# Patient Record
Sex: Female | Born: 1965 | Race: White | Hispanic: No | Marital: Single | Smoking: Never smoker
Health system: Southern US, Community
[De-identification: ages and names within clinical notes are randomized; demographics above are authoritative.]

## PROBLEM LIST (undated history)

## (undated) DIAGNOSIS — J45909 Unspecified asthma, uncomplicated: Secondary | ICD-10-CM

## (undated) DIAGNOSIS — J449 Chronic obstructive pulmonary disease, unspecified: Secondary | ICD-10-CM

## (undated) DIAGNOSIS — E119 Type 2 diabetes mellitus without complications: Secondary | ICD-10-CM

## (undated) DIAGNOSIS — N289 Disorder of kidney and ureter, unspecified: Secondary | ICD-10-CM

## (undated) DIAGNOSIS — I10 Essential (primary) hypertension: Secondary | ICD-10-CM

## (undated) HISTORY — PX: OTHER SURGICAL HISTORY: SHX169

## (undated) HISTORY — PX: ABDOMINAL HYSTERECTOMY: SHX81

## (undated) HISTORY — PX: BREAST REDUCTION SURGERY: SHX8

## (undated) HISTORY — PX: TONSILLECTOMY: SUR1361

---

## 2012-04-25 ENCOUNTER — Emergency Department (HOSPITAL_COMMUNITY): Payer: Medicare Other

## 2012-04-25 ENCOUNTER — Encounter (HOSPITAL_COMMUNITY): Payer: Self-pay | Admitting: Emergency Medicine

## 2012-04-25 ENCOUNTER — Emergency Department (HOSPITAL_COMMUNITY)
Admission: EM | Admit: 2012-04-25 | Discharge: 2012-04-25 | Disposition: A | Discharge status: Home / Self Care | Payer: Medicare Other | Attending: Emergency Medicine | Admitting: Emergency Medicine

## 2012-04-25 DIAGNOSIS — R110 Nausea: Secondary | ICD-10-CM | POA: Insufficient documentation

## 2012-04-25 DIAGNOSIS — IMO0002 Reserved for concepts with insufficient information to code with codable children: Secondary | ICD-10-CM | POA: Insufficient documentation

## 2012-04-25 DIAGNOSIS — R0789 Other chest pain: Secondary | ICD-10-CM

## 2012-04-25 DIAGNOSIS — E1169 Type 2 diabetes mellitus with other specified complication: Secondary | ICD-10-CM | POA: Insufficient documentation

## 2012-04-25 DIAGNOSIS — Z79899 Other long term (current) drug therapy: Secondary | ICD-10-CM | POA: Insufficient documentation

## 2012-04-25 DIAGNOSIS — J4489 Other specified chronic obstructive pulmonary disease: Secondary | ICD-10-CM | POA: Insufficient documentation

## 2012-04-25 DIAGNOSIS — R071 Chest pain on breathing: Secondary | ICD-10-CM | POA: Insufficient documentation

## 2012-04-25 DIAGNOSIS — R0602 Shortness of breath: Secondary | ICD-10-CM | POA: Insufficient documentation

## 2012-04-25 DIAGNOSIS — R739 Hyperglycemia, unspecified: Secondary | ICD-10-CM

## 2012-04-25 DIAGNOSIS — I10 Essential (primary) hypertension: Secondary | ICD-10-CM | POA: Insufficient documentation

## 2012-04-25 DIAGNOSIS — Z87442 Personal history of urinary calculi: Secondary | ICD-10-CM | POA: Insufficient documentation

## 2012-04-25 DIAGNOSIS — J449 Chronic obstructive pulmonary disease, unspecified: Secondary | ICD-10-CM | POA: Insufficient documentation

## 2012-04-25 HISTORY — DX: Essential (primary) hypertension: I10

## 2012-04-25 HISTORY — DX: Type 2 diabetes mellitus without complications: E11.9

## 2012-04-25 HISTORY — DX: Disorder of kidney and ureter, unspecified: N28.9

## 2012-04-25 HISTORY — DX: Unspecified asthma, uncomplicated: J45.909

## 2012-04-25 HISTORY — DX: Chronic obstructive pulmonary disease, unspecified: J44.9

## 2012-04-25 LAB — HEPATIC FUNCTION PANEL
ALT: 35 U/L (ref 0–35)
AST: 25 U/L (ref 0–37)
Total Protein: 6.7 g/dL (ref 6.0–8.3)

## 2012-04-25 LAB — BASIC METABOLIC PANEL
CO2: 29 mEq/L (ref 19–32)
Chloride: 99 mEq/L (ref 96–112)
Glucose, Bld: 336 mg/dL — ABNORMAL HIGH (ref 70–99)
Potassium: 4.2 mEq/L (ref 3.5–5.1)
Sodium: 138 mEq/L (ref 135–145)

## 2012-04-25 LAB — CBC
Hemoglobin: 12.8 g/dL (ref 12.0–15.0)
RBC: 4.63 MIL/uL (ref 3.87–5.11)
WBC: 9.6 10*3/uL (ref 4.0–10.5)

## 2012-04-25 LAB — GLUCOSE, CAPILLARY: Glucose-Capillary: 271 mg/dL — ABNORMAL HIGH (ref 70–99)

## 2012-04-25 LAB — POCT I-STAT TROPONIN I: Troponin i, poc: 0 ng/mL (ref 0.00–0.08)

## 2012-04-25 MED ORDER — ONDANSETRON HCL 4 MG/2ML IJ SOLN
4.0000 mg | Freq: Once | INTRAMUSCULAR | Status: AC
  Administered 2012-04-25: 4 mg via INTRAVENOUS
  Filled 2012-04-25: qty 2

## 2012-04-25 MED ORDER — MORPHINE SULFATE 4 MG/ML IJ SOLN
4.0000 mg | Freq: Once | INTRAMUSCULAR | Status: AC
  Administered 2012-04-25: 4 mg via INTRAVENOUS
  Filled 2012-04-25: qty 1

## 2012-04-25 MED ORDER — NAPROXEN 500 MG PO TABS: 500.0000 mg | ORAL_TABLET | Freq: Two times a day (BID) | ORAL | Status: DC

## 2012-04-25 MED ORDER — KETOROLAC TROMETHAMINE 30 MG/ML IJ SOLN
30.0000 mg | Freq: Once | INTRAMUSCULAR | Status: AC
  Administered 2012-04-25: 30 mg via INTRAVENOUS
  Filled 2012-04-25: qty 1

## 2012-04-25 NOTE — ED Provider Notes (Signed)
Medical screening examination/treatment/procedure(s) were performed by non-physician practitioner and as supervising physician I was immediately available for consultation/collaboration.   Richardean Canal, MD 04/25/12 2350

## 2012-04-25 NOTE — ED Provider Notes (Signed)
History     CSN: [id number]  Arrival date & time 04/25/12  1949   First MD Initiated Contact with Patient 04/25/12 2052      Chief Complaint  Patient presents with  . Chest Pain    (Consider location/radiation/quality/duration/timing/severity/associated sxs/prior treatment) Patient is a 47 y.o. female presenting with chest pain. The history is provided by the patient.  Chest Pain Pain location:  R chest Pain quality: aching and radiating   Pain radiates to:  R shoulder Pain radiates to the back: no   Pain severity:  Mild Onset quality:  Sudden Duration:  2 days Timing:  Constant Chronicity:  New Context: at rest   Relieved by:  Nothing Worsened by:  Exertion Ineffective treatments:  Aspirin Associated symptoms: nausea and shortness of breath   Associated symptoms: no cough, no fever, no numbness, no palpitations, not vomiting and no weakness     Past Medical History  Diagnosis Date  . Diabetes mellitus without complication   . Hypertension   . Asthma   . COPD (chronic obstructive pulmonary disease)   . Renal disorder     kidney stones    Past Surgical History  Procedure Laterality Date  . Tonsillectomy    . Breast reduction surgery    . Cataracts    . Abdominal hysterectomy      No family history on file.  History  Substance Use Topics  . Smoking status: Never Smoker   . Smokeless tobacco: Not on file  . Alcohol Use: No    OB History   Grav Para Term Preterm Abortions TAB SAB Ect Mult Living                  Review of Systems  Constitutional: Negative for fever and chills.  Respiratory: Positive for shortness of breath. Negative for cough.   Cardiovascular: Positive for chest pain. Negative for palpitations and leg swelling.  Gastrointestinal: Positive for nausea. Negative for vomiting.  Genitourinary: Negative for dysuria.  Skin: Negative for rash and wound.  Neurological: Negative for weakness and numbness.    Allergies  Codeine and  Doxycycline  Home Medications   Current Outpatient Rx  Name  Route  Sig  Dispense  Refill  . albuterol (PROVENTIL) (2.5 MG/3ML) 0.083% nebulizer solution   Nebulization   Take 2.5 mg by nebulization every 6 (six) hours as needed for wheezing.         . Fluticasone-Salmeterol (ADVAIR) 250-50 MCG/DOSE AEPB   Inhalation   Inhale 1 puff into the lungs every 12 (twelve) hours.         Marland Kitchen glipiZIDE (GLUCOTROL) 10 MG tablet   Oral   Take 10 mg by mouth every morning.         Marland Kitchen lisinopril-hydrochlorothiazide (PRINZIDE,ZESTORETIC) 20-12.5 MG per tablet   Oral   Take 1 tablet by mouth 2 (two) times daily.         . metFORMIN (GLUCOPHAGE) 1000 MG tablet   Oral   Take 1,000 mg by mouth 2 (two) times daily with a meal.         . montelukast (SINGULAIR) 10 MG tablet   Oral   Take 10 mg by mouth every morning.         . naproxen (NAPROSYN) 500 MG tablet   Oral   Take 1 tablet (500 mg total) by mouth 2 (two) times daily with a meal.   20 tablet   0     BP 157/98  Pulse 95  Temp(Src) 98.1 F (36.7 C) (Oral)  Resp 10  Ht 5\' 6"  (1.676 m)  Wt 184 lb (83.462 kg)  BMI 29.71 kg/m2  SpO2 97%  Physical Exam  Constitutional: She is oriented to person, place, and time. She appears well-developed and well-nourished.  HENT:  Head: Normocephalic.  Eyes: Pupils are equal, round, and reactive to light.  Neck: Normal range of motion.  Cardiovascular: Normal rate.   Pulmonary/Chest: Effort normal. She has no wheezes. She exhibits tenderness.  Abdominal: Soft. She exhibits no distension. There is no tenderness.  Musculoskeletal: Normal range of motion.  Neurological: She is alert and oriented to person, place, and time.  Skin: Skin is warm and dry. No pallor.    ED Course  Procedures (including critical care time)  Labs Reviewed  BASIC METABOLIC PANEL - Abnormal; Notable for the following:    Glucose, Bld 336 (*)    All other components within normal limits  HEPATIC  FUNCTION PANEL - Abnormal; Notable for the following:    Albumin 3.2 (*)    All other components within normal limits  GLUCOSE, CAPILLARY - Abnormal; Notable for the following:    Glucose-Capillary 271 (*)    All other components within normal limits  CBC  D-DIMER, QUANTITATIVE  POCT I-STAT TROPONIN I   Dg Chest 2 View  04/25/2012  *RADIOLOGY REPORT*  Clinical Data: Chest pain, shortness of breath, COPD.  History of asthma, hypertension, diabetes.  CHEST - 2 VIEW  Comparison: 08/17/2004  Findings: The heart size and pulmonary vascularity are normal. The lungs appear clear and expanded without focal air space disease or consolidation. No blunting of the costophrenic angles. No pneumothorax.  Mediastinal contours appear intact.  IMPRESSION: No evidence of active pulmonary disease.   Original Report Authenticated By: Burman Nieves, M.D.     ED ECG REPORT   Date: 04/25/2012  EKG Time: 11:26 PM  Rate: 111  Rhythm: sinus tachycardia,  there are no previous tracings available for comparison  Axis: normal  Intervals:none  ST&T Change: 1 PVC   Narrative Interpretation: normal except for 1 PVC           1. Chest wall pain   2. Hyperglycemia       MDM  Labs, xray reviewed  All within limits discussed this with patient, plan DC and FU with PCP        Arman Filter, NP 04/25/12 2326

## 2012-04-25 NOTE — ED Notes (Signed)
Pt c/o R sided CP onset yesterday, burning and sharp, radiation to R am (tingling) and across chest, pt states pain "takes her breath" pt states she has been nauseated at times, EKG completed in triage.

## 2013-06-18 ENCOUNTER — Emergency Department (HOSPITAL_BASED_OUTPATIENT_CLINIC_OR_DEPARTMENT_OTHER)
Admission: EM | Admit: 2013-06-18 | Discharge: 2013-06-18 | Disposition: A | Discharge status: Other Acute Inpatient Hospital | Payer: Medicare Other | Attending: Emergency Medicine | Admitting: Emergency Medicine

## 2013-06-18 ENCOUNTER — Emergency Department (HOSPITAL_BASED_OUTPATIENT_CLINIC_OR_DEPARTMENT_OTHER): Payer: Medicare Other

## 2013-06-18 ENCOUNTER — Encounter (HOSPITAL_BASED_OUTPATIENT_CLINIC_OR_DEPARTMENT_OTHER): Payer: Self-pay | Admitting: Emergency Medicine

## 2013-06-18 DIAGNOSIS — E119 Type 2 diabetes mellitus without complications: Secondary | ICD-10-CM | POA: Insufficient documentation

## 2013-06-18 DIAGNOSIS — Z87442 Personal history of urinary calculi: Secondary | ICD-10-CM | POA: Insufficient documentation

## 2013-06-18 DIAGNOSIS — Z79899 Other long term (current) drug therapy: Secondary | ICD-10-CM | POA: Insufficient documentation

## 2013-06-18 DIAGNOSIS — IMO0002 Reserved for concepts with insufficient information to code with codable children: Secondary | ICD-10-CM | POA: Insufficient documentation

## 2013-06-18 DIAGNOSIS — J45901 Unspecified asthma with (acute) exacerbation: Secondary | ICD-10-CM

## 2013-06-18 DIAGNOSIS — R0789 Other chest pain: Secondary | ICD-10-CM | POA: Insufficient documentation

## 2013-06-18 DIAGNOSIS — R079 Chest pain, unspecified: Secondary | ICD-10-CM

## 2013-06-18 DIAGNOSIS — M79609 Pain in unspecified limb: Secondary | ICD-10-CM | POA: Insufficient documentation

## 2013-06-18 DIAGNOSIS — I4949 Other premature depolarization: Secondary | ICD-10-CM | POA: Insufficient documentation

## 2013-06-18 DIAGNOSIS — J441 Chronic obstructive pulmonary disease with (acute) exacerbation: Secondary | ICD-10-CM | POA: Insufficient documentation

## 2013-06-18 DIAGNOSIS — Z791 Long term (current) use of non-steroidal anti-inflammatories (NSAID): Secondary | ICD-10-CM | POA: Insufficient documentation

## 2013-06-18 DIAGNOSIS — I10 Essential (primary) hypertension: Secondary | ICD-10-CM | POA: Insufficient documentation

## 2013-06-18 LAB — CBC WITH DIFFERENTIAL/PLATELET
BASOS ABS: 0 10*3/uL (ref 0.0–0.1)
Basophils Relative: 0 % (ref 0–1)
EOS PCT: 1 % (ref 0–5)
Eosinophils Absolute: 0.2 10*3/uL (ref 0.0–0.7)
HEMATOCRIT: 35.3 % — AB (ref 36.0–46.0)
Hemoglobin: 10.7 g/dL — ABNORMAL LOW (ref 12.0–15.0)
LYMPHS ABS: 2.5 10*3/uL (ref 0.7–4.0)
LYMPHS PCT: 23 % (ref 12–46)
MCH: 25.8 pg — AB (ref 26.0–34.0)
MCHC: 30.3 g/dL (ref 30.0–36.0)
MCV: 85.3 fL (ref 78.0–100.0)
MONO ABS: 0.9 10*3/uL (ref 0.1–1.0)
Monocytes Relative: 8 % (ref 3–12)
Neutro Abs: 7.4 10*3/uL (ref 1.7–7.7)
Neutrophils Relative %: 68 % (ref 43–77)
Platelets: 342 10*3/uL (ref 150–400)
RBC: 4.14 MIL/uL (ref 3.87–5.11)
RDW: 15.2 % (ref 11.5–15.5)
WBC: 11 10*3/uL — AB (ref 4.0–10.5)

## 2013-06-18 LAB — BASIC METABOLIC PANEL
BUN: 21 mg/dL (ref 6–23)
CO2: 27 mEq/L (ref 19–32)
Calcium: 8.8 mg/dL (ref 8.4–10.5)
Chloride: 99 mEq/L (ref 96–112)
Creatinine, Ser: 1 mg/dL (ref 0.50–1.10)
GFR calc Af Amer: 77 mL/min — ABNORMAL LOW (ref >90)
GFR calc non Af Amer: 66 mL/min — ABNORMAL LOW (ref >90)
GLUCOSE: 195 mg/dL — AB (ref 70–99)
POTASSIUM: 3.9 meq/L (ref 3.7–5.3)
SODIUM: 142 meq/L (ref 137–147)

## 2013-06-18 LAB — TROPONIN I: Troponin I: <0.3 ng/mL (ref <0.30)

## 2013-06-18 MED ORDER — HYDROCODONE-ACETAMINOPHEN 5-325 MG PO TABS
2.0000 | ORAL_TABLET | Freq: Once | ORAL | Status: AC
  Administered 2013-06-18: 2 via ORAL
  Filled 2013-06-18: qty 2

## 2013-06-18 MED ORDER — NITROGLYCERIN 0.4 MG SL SUBL
0.4000 mg | SUBLINGUAL_TABLET | SUBLINGUAL | Status: DC | PRN
  Filled 2013-06-18: qty 25

## 2013-06-18 MED ORDER — SODIUM CHLORIDE 0.9 % IV BOLUS (SEPSIS)
500.0000 mL | Freq: Once | INTRAVENOUS | Status: AC
  Administered 2013-06-18: 500 mL via INTRAVENOUS

## 2013-06-18 MED ORDER — ASPIRIN 81 MG PO CHEW
324.0000 mg | CHEWABLE_TABLET | Freq: Once | ORAL | Status: AC
  Administered 2013-06-18: 324 mg via ORAL
  Filled 2013-06-18: qty 4

## 2013-06-18 NOTE — ED Notes (Signed)
PA at bedside.

## 2013-06-18 NOTE — ED Notes (Signed)
Patient transported to X-ray via stretcher per tech. 

## 2013-06-18 NOTE — ED Notes (Addendum)
CP started 530pm-pt states she worse heart monitor x 2 weeks for c/o CP and palpitations-ended yesterday

## 2013-06-18 NOTE — ED Provider Notes (Signed)
Date: 06/18/2013  Rate: 91  Rhythm: normal sinus rhythm  QRS Axis: normal  Intervals: normal  ST/T Wave abnormalities: normal  Conduction Disutrbances:none  Narrative Interpretation:   Old EKG Reviewed: unchanged No significant change in EKG compared to her worry eighth 2014. Patient has occasional PVCs. There is nonspecific ST. and T wave changes.     Shelda JakesScott W. Anabelen Kaminsky, MD 06/18/13 [phone]

## 2013-06-18 NOTE — ED Provider Notes (Signed)
Medical screening examination/treatment/procedure(s) were conducted as a shared visit with non-physician practitioner(s) and myself.  I personally evaluated the patient during the encounter.   EKG Interpretation None      EKG documented on blank note. The patient with chest pain that started around 5:30 in the afternoon. Cannot rule out acute cardiac event but no evidence of that on initial labs. Patient will require admission for chest pain rule out. [facility]. Patient also with a complaint of a stiff leg. The etiology of this is not clear. Labs without significant findings. EKG has no acute changes.  Results for orders placed during the hospital encounter of 06/18/13  CBC WITH DIFFERENTIAL      Result Value Ref Range   WBC 11.0 (*) 4.0 - 10.5 K/uL   RBC 4.14  3.87 - 5.11 MIL/uL   Hemoglobin 10.7 (*) 12.0 - 15.0 g/dL   HCT 56.235.3 (*) 13.036.0 - 86.546.0 %   MCV 85.3  78.0 - 100.0 fL   MCH 25.8 (*) 26.0 - 34.0 pg   MCHC 30.3  30.0 - 36.0 g/dL   RDW 78.415.2  69.611.5 - 29.515.5 %   Platelets 342  150 - 400 K/uL   Neutrophils Relative % 68  43 - 77 %   Neutro Abs 7.4  1.7 - 7.7 K/uL   Lymphocytes Relative 23  12 - 46 %   Lymphs Abs 2.5  0.7 - 4.0 K/uL   Monocytes Relative 8  3 - 12 %   Monocytes Absolute 0.9  0.1 - 1.0 K/uL   Eosinophils Relative 1  0 - 5 %   Eosinophils Absolute 0.2  0.0 - 0.7 K/uL   Basophils Relative 0  0 - 1 %   Basophils Absolute 0.0  0.0 - 0.1 K/uL  BASIC METABOLIC PANEL      Result Value Ref Range   Sodium 142  137 - 147 mEq/L   Potassium 3.9  3.7 - 5.3 mEq/L   Chloride 99  96 - 112 mEq/L   CO2 27  19 - 32 mEq/L   Glucose, Bld 195 (*) 70 - 99 mg/dL   BUN 21  6 - 23 mg/dL   Creatinine, Ser 2.841.00  0.50 - 1.10 mg/dL   Calcium 8.8  8.4 - 13.210.5 mg/dL   GFR calc non Af Amer 66 (*) >90 mL/min   GFR calc Af Amer 77 (*) >90 mL/min  TROPONIN I      Result Value Ref Range   Troponin I <0.30  <0.30 ng/mL  TROPONIN I      Result Value Ref  Range   Troponin I <0.30  <0.30 ng/mL   Dg Chest 2 View  06/18/2013   CLINICAL DATA:  Chest pain  EXAM: CHEST  2 VIEW  COMPARISON:  05/21/2013  FINDINGS: The heart size and mediastinal contours are within normal limits. Both lungs are clear. The visualized skeletal structures are unremarkable.  IMPRESSION: No active cardiopulmonary disease.   Electronically Signed   By: Alcide CleverMark  Lukens M.D.   On: 06/18/2013 19:13      Shelda JakesScott W. Kolten Ryback, MD 06/18/13 2241

## 2013-06-18 NOTE — ED Provider Notes (Signed)
CSN: [id number]     Arrival date & time 06/18/13  1827 History   None    Chief Complaint  Patient presents with  . Chest Pain     (Consider location/radiation/quality/duration/timing/severity/associated sxs/prior Treatment) Patient is a 48 y.o. female presenting with chest pain. The history is provided by the patient. No language interpreter was used.  Chest Pain Pain location:  L chest Pain quality: aching   Pain radiates to:  R arm Pain radiates to the back: yes   Pain severity:  Moderate Duration:  2 hours Timing:  Constant Progression:  Worsening Chronicity:  New Relieved by:  Nothing Worsened by:  Nothing tried Associated symptoms: shortness of breath   Associated symptoms: no abdominal pain   Risk factors: diabetes mellitus and hypertension    Pt reports feels like a bee sting in right arm and right leg.   Pt had a stress test 2 weeks ago.   Pt reports holter monitor worn.  No hx of cath.   Pt has a history of COPD Past Medical History  Diagnosis Date  . Diabetes mellitus without complication   . Hypertension   . Asthma   . COPD (chronic obstructive pulmonary disease)   . Renal disorder     kidney stones   Past Surgical History  Procedure Laterality Date  . Tonsillectomy    . Breast reduction surgery    . Cataracts    . Abdominal hysterectomy     No family history on file. History  Substance Use Topics  . Smoking status: Never Smoker   . Smokeless tobacco: Not on file  . Alcohol Use: No   OB History   Grav Para Term Preterm Abortions TAB SAB Ect Mult Living                 Review of Systems  Respiratory: Positive for shortness of breath.   Cardiovascular: Positive for chest pain.  Gastrointestinal: Negative for abdominal pain.  All other systems reviewed and are negative.      Allergies  Codeine and Doxycycline  Home Medications   Current Outpatient Rx  Name  Route  Sig  Dispense  Refill  . Iron-FA-B Cmp-C-Biot-Probiotic (FUSION PLUS PO)   Oral   Take by mouth.         . metoCLOPramide (REGLAN) 10 MG tablet   Oral   Take 10 mg by mouth 4 (four) times daily.         . metoprolol tartrate (LOPRESSOR) 25 MG tablet   Oral   Take 25 mg by mouth 2 (two) times daily.         Marland Kitchen. OMEPRAZOLE PO   Oral   Take by mouth.         . pioglitazone (ACTOS) 15 MG tablet   Oral   Take 15 mg by mouth daily.         . SUCRALFATE PO   Oral   Take by mouth.         . theophylline (THEO-24) 300 MG 24 hr capsule   Oral   Take 300 mg by mouth daily.         Marland Kitchen. albuterol (PROVENTIL) (2.5 MG/3ML) 0.083% nebulizer solution   Nebulization   Take 2.5 mg by nebulization every 6 (six) hours as needed for wheezing.         . Fluticasone-Salmeterol (ADVAIR) 250-50 MCG/DOSE AEPB   Inhalation   Inhale 1 puff into the lungs every 12 (twelve) hours.         .Marland Kitchen  glipiZIDE (GLUCOTROL) 10 MG tablet   Oral   Take 10 mg by mouth every morning.         Marland Kitchen lisinopril-hydrochlorothiazide (PRINZIDE,ZESTORETIC) 20-12.5 MG per tablet   Oral   Take 1 tablet by mouth 2 (two) times daily.         . metFORMIN (GLUCOPHAGE) 1000 MG tablet   Oral   Take 1,000 mg by mouth 2 (two) times daily with a meal.         . montelukast (SINGULAIR) 10 MG tablet   Oral   Take 10 mg by mouth every morning.         . naproxen (NAPROSYN) 500 MG tablet   Oral   Take 1 tablet (500 mg total) by mouth 2 (two) times daily with a meal.   20 tablet   0    BP 99/70  Pulse 89  Temp(Src) 98.7 F (37.1 C) (Oral)  Resp 20  Ht 5\' 7"  (1.702 m)  Wt 183 lb (83.008 kg)  BMI 28.66 kg/m2  SpO2 99% Physical Exam  Nursing note and vitals reviewed. Constitutional: She is oriented to person, place, and time. She appears well-developed and well-nourished.  HENT:  Head: Normocephalic and atraumatic.  Eyes: Conjunctivae and EOM are normal. Pupils are equal, round, and reactive to light.  Neck: Normal range of motion.  Cardiovascular: Normal rate and  normal heart sounds.   Pulmonary/Chest: Effort normal.  Abdominal: Soft. She exhibits no distension.  Musculoskeletal: Normal range of motion.  Neurological: She is alert and oriented to person, place, and time.  Skin: Skin is warm.  Psychiatric: She has a normal mood and affect.    ED Course  Procedures (including critical care time) Labs Review Labs Reviewed  CBC WITH DIFFERENTIAL - Abnormal; Notable for the following:    WBC 11.0 (*)    Hemoglobin 10.7 (*)    HCT 35.3 (*)    MCH 25.8 (*)    All other components within normal limits  BASIC METABOLIC PANEL - Abnormal; Notable for the following:    Glucose, Bld 195 (*)    GFR calc non Af Amer 66 (*)    GFR calc Af Amer 77 (*)    All other components within normal limits  TROPONIN I   Imaging Review Dg Chest 2 View  06/18/2013   CLINICAL DATA:  Chest pain  EXAM: CHEST  2 VIEW  COMPARISON:  05/21/2013  FINDINGS: The heart size and mediastinal contours are within normal limits. Both lungs are clear. The visualized skeletal structures are unremarkable.  IMPRESSION: No active cardiopulmonary disease.   Electronically Signed   By: Alcide Clever M.D.   On: 06/18/2013 19:13     EKG Interpretation None      MDM   Pt's pain is very atypical but she does have cardiac risk factors   Final diagnoses:  Chest pain    EKg normal except PVC's.     I spoke to Dr. Linard Millers at [facility].  He will admit pt for rule out.   Pt given hydrocodone for pain.     Lonia Skinner Steward, PA-C 06/18/13 2239

## 2013-06-18 NOTE — ED Notes (Signed)
Pt c/o body 'burning', pa notified.  Appears in nad, assessment unchanged.

## 2013-09-15 IMAGING — CR DG CHEST 2V
2 series · 2 of 2 positions shown · non-contrast
Comparison: 08/17/2004

CLINICAL DATA: Chest pain, shortness of breath, COPD.  History of
asthma, hypertension, diabetes.

CHEST - 2 VIEW

[w chest pa]
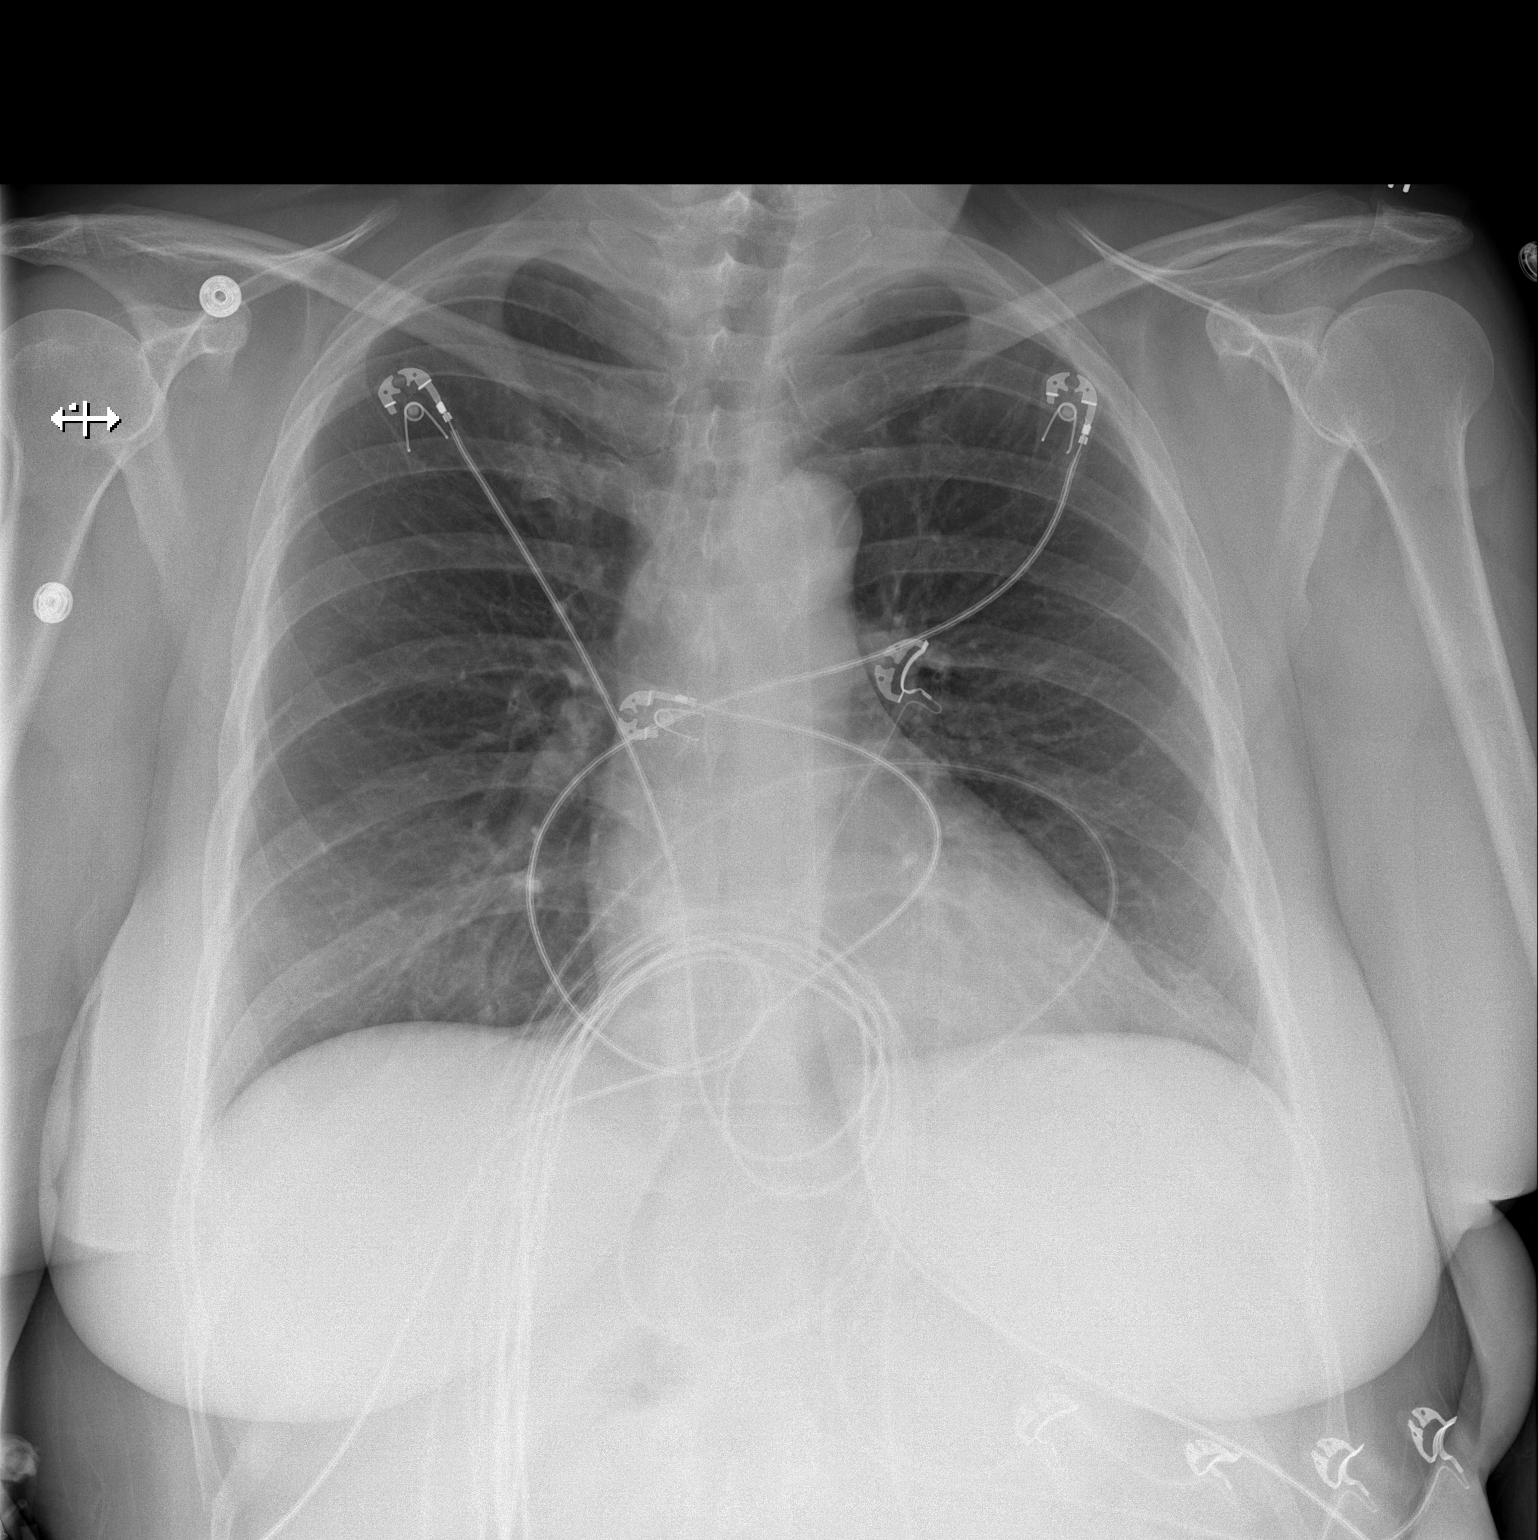

[w chest lat]
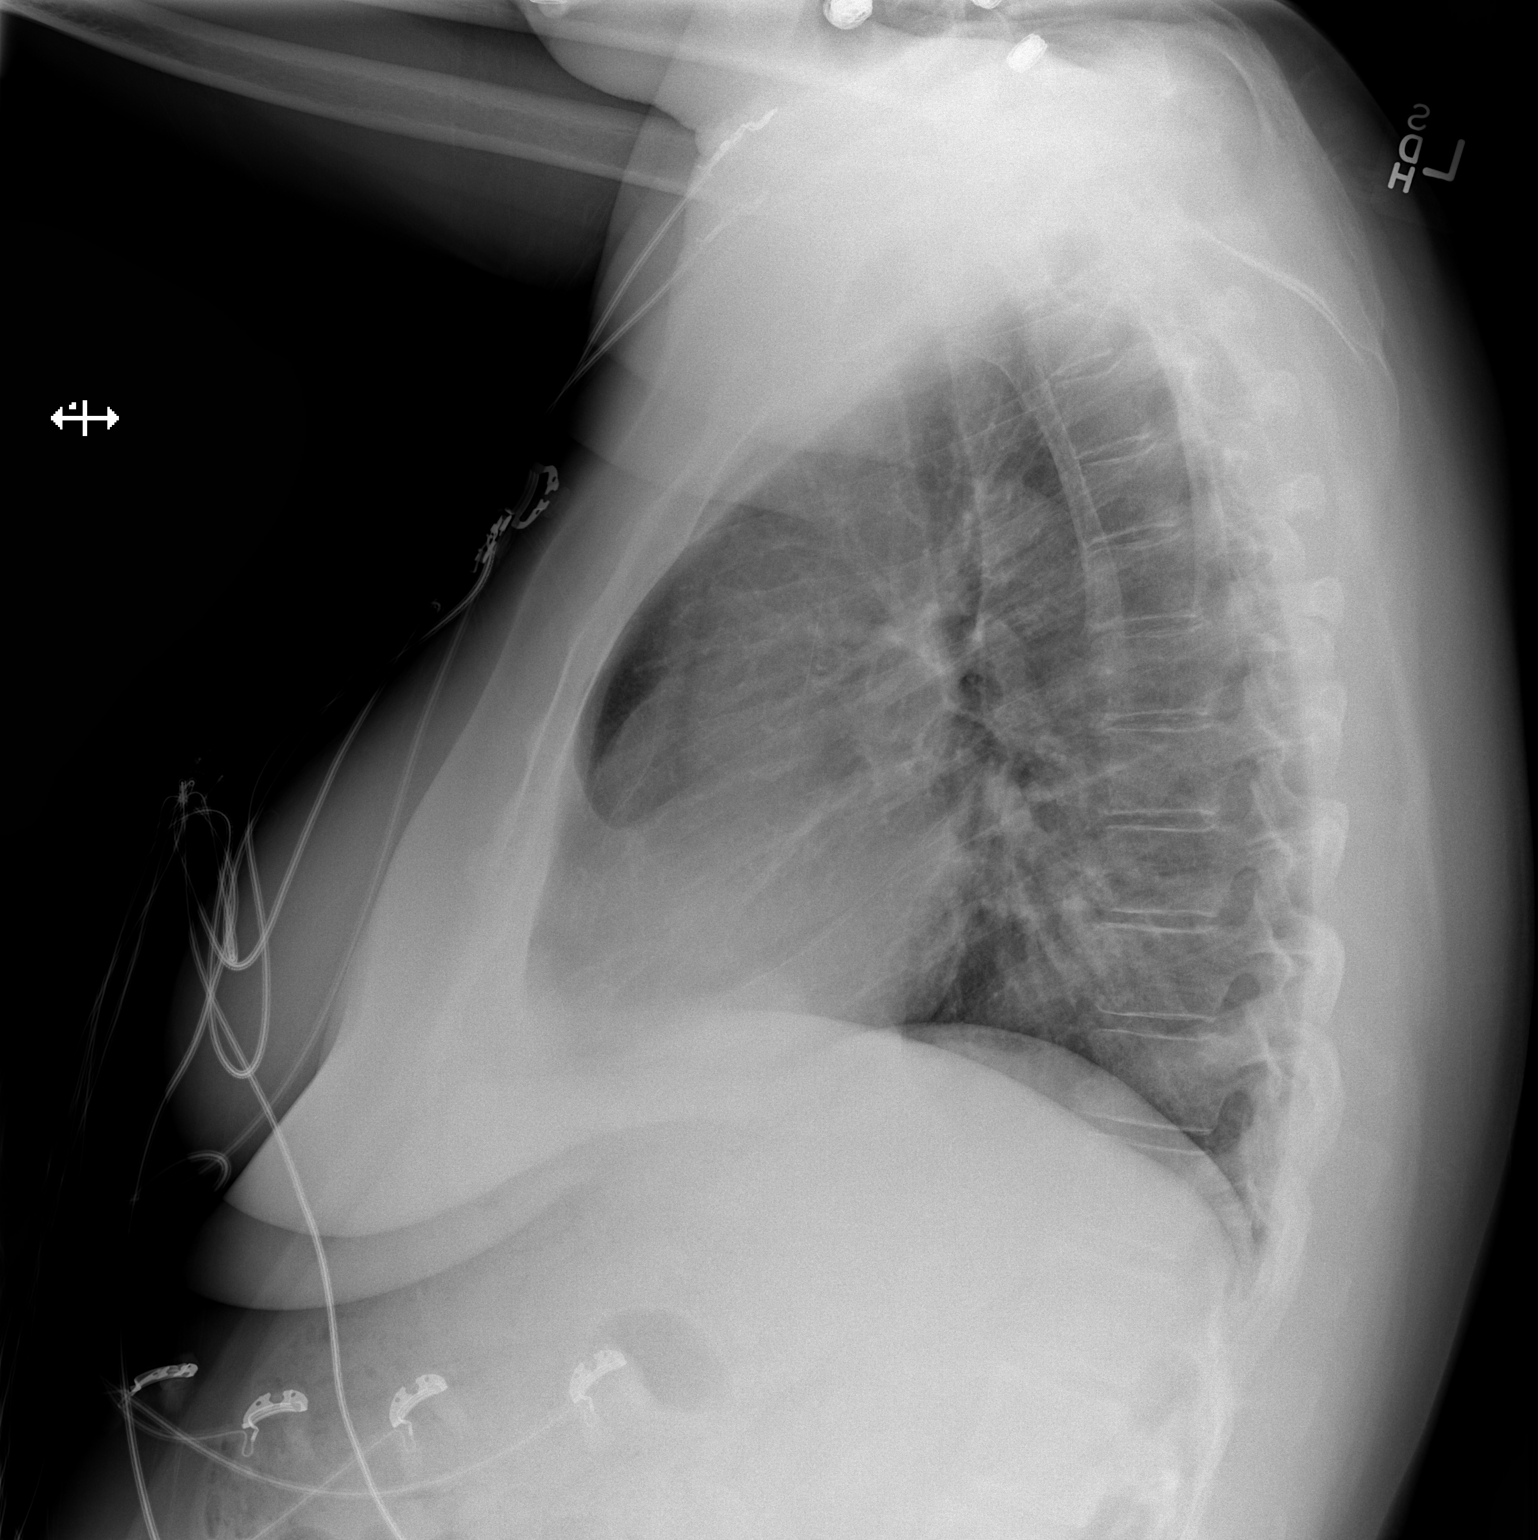

[2 of 2 positions shown; findings below may reference images not displayed]

FINDINGS: The heart size and pulmonary vascularity are normal. The
lungs appear clear and expanded without focal air space disease or
consolidation. No blunting of the costophrenic angles. No
pneumothorax.  Mediastinal contours appear intact.
IMPRESSION: No evidence of active pulmonary disease.

## 2014-11-08 IMAGING — CR DG CHEST 2V
2 series · 2 of 2 positions shown · non-contrast
Comparison: 05/21/2013

CLINICAL DATA: Chest pain

EXAM:
CHEST  2 VIEW

[w chest pa]
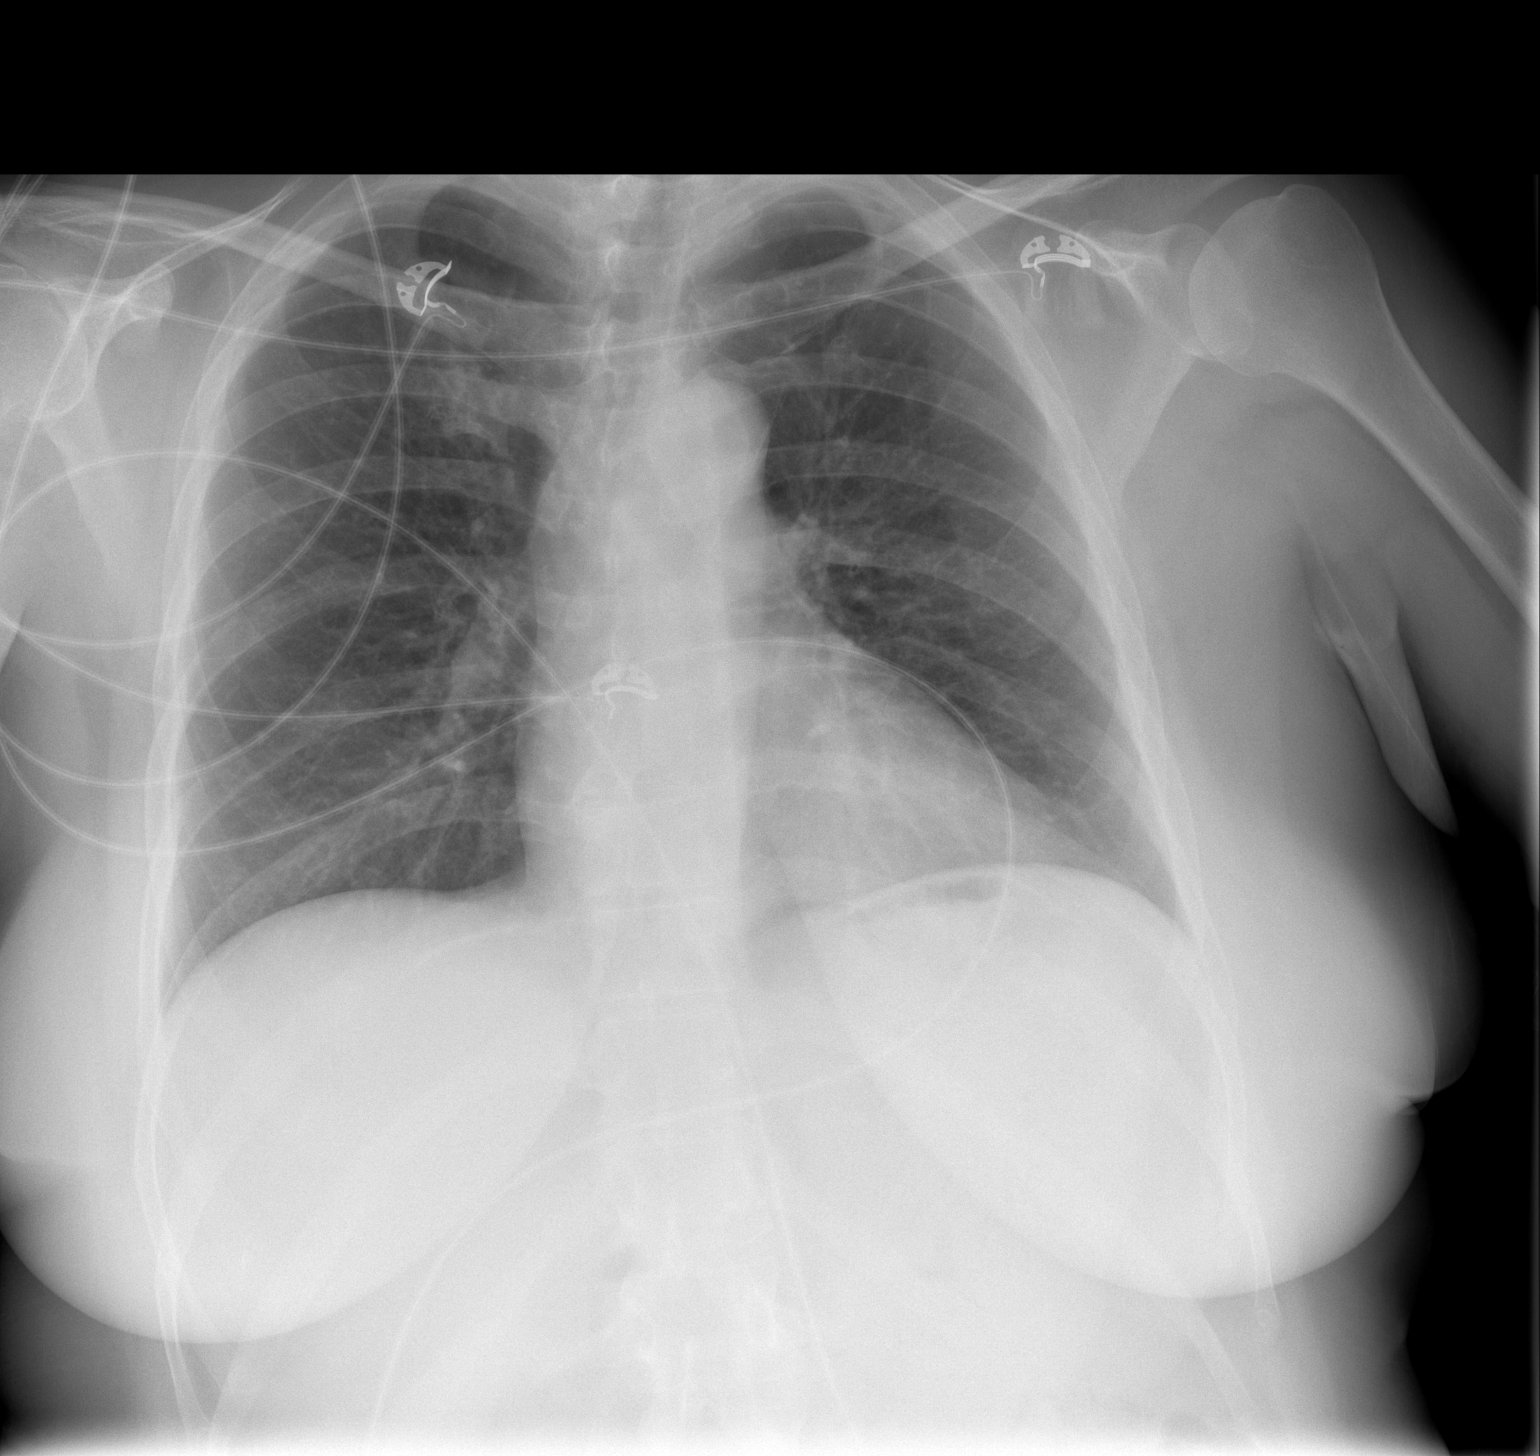

[w chest lat]
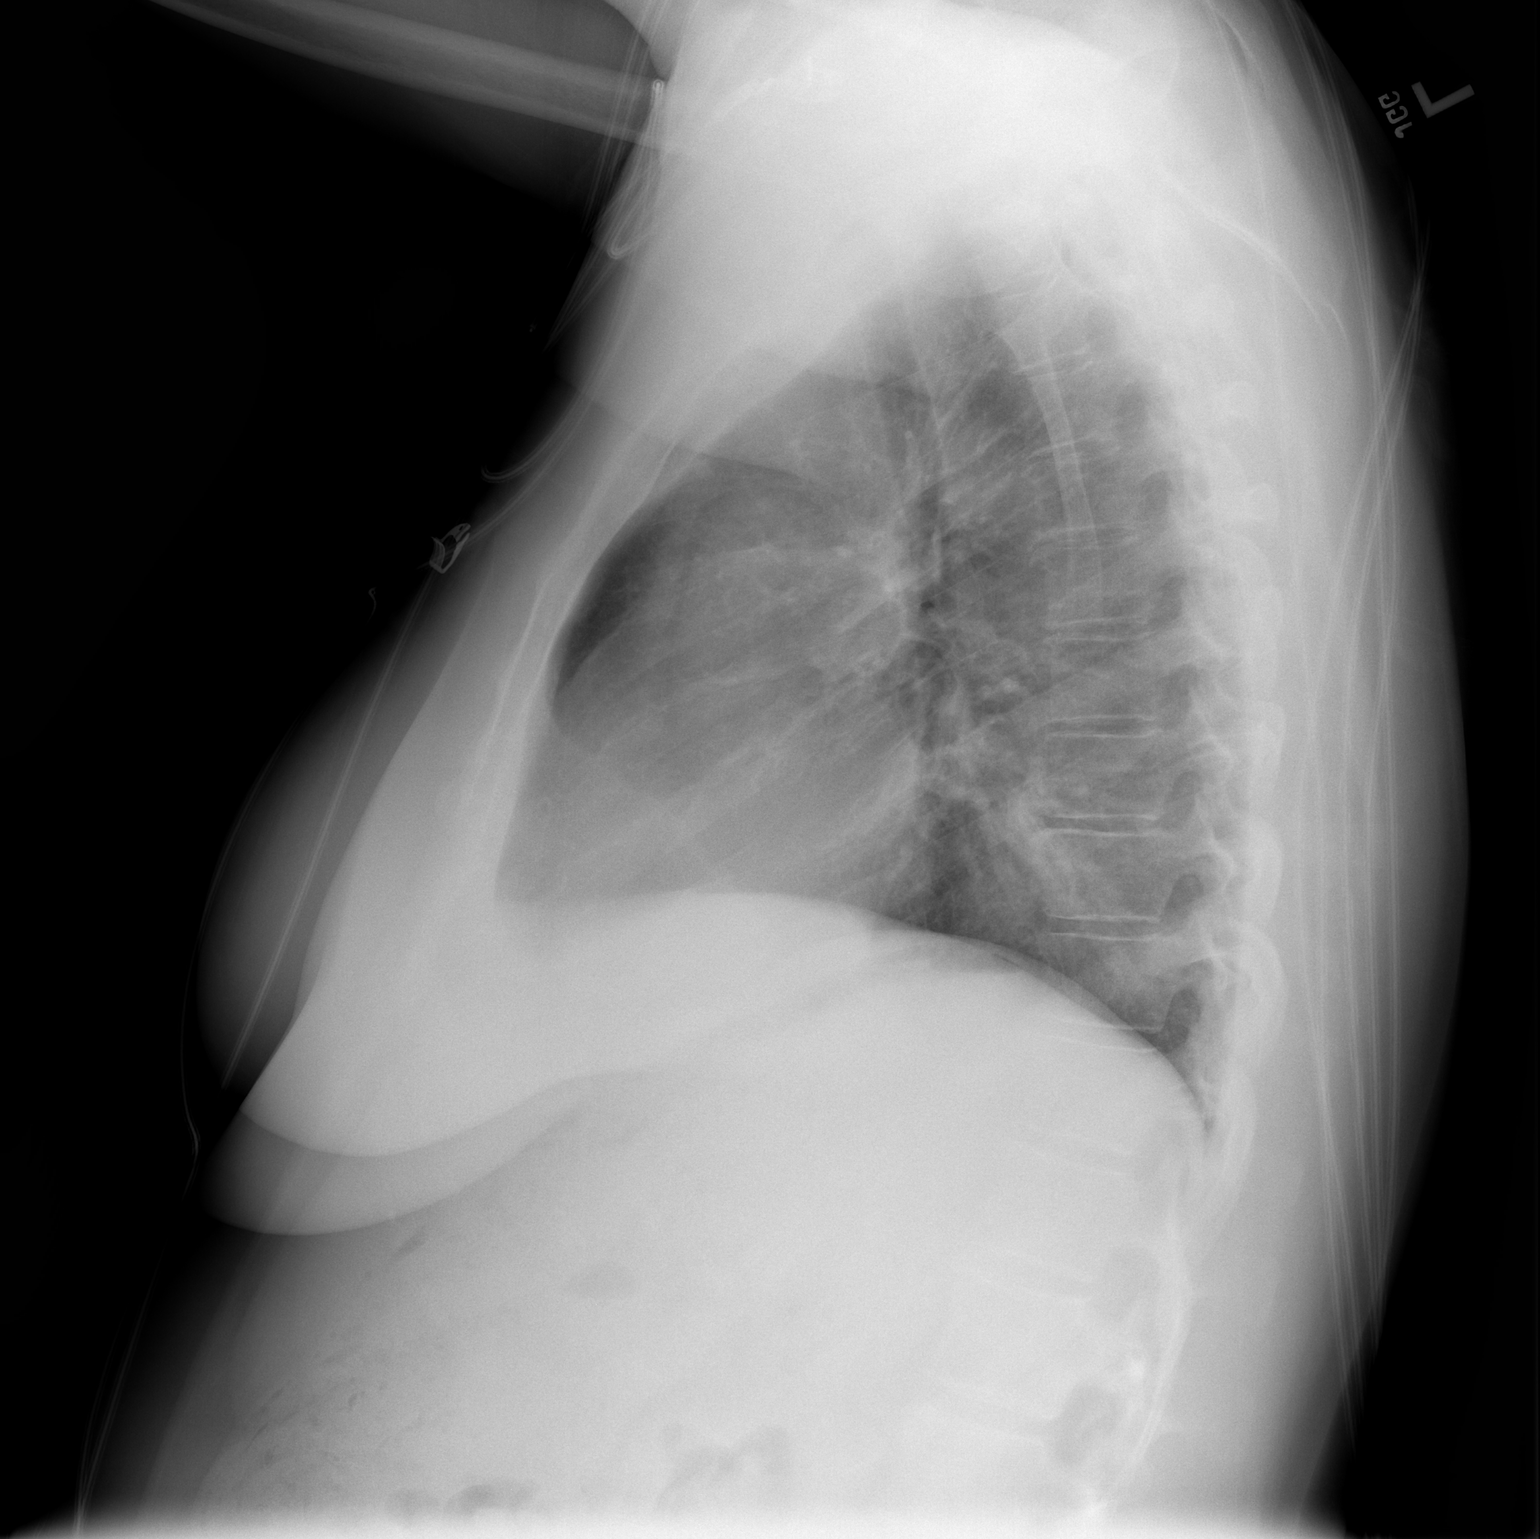

[2 of 2 positions shown; findings below may reference images not displayed]

FINDINGS: The heart size and mediastinal contours are within normal limits.
Both lungs are clear. The visualized skeletal structures are
unremarkable.
IMPRESSION: No active cardiopulmonary disease.

## 2019-01-12 ENCOUNTER — Encounter: Payer: Self-pay | Admitting: Cardiovascular Disease

## 2019-01-12 ENCOUNTER — Ambulatory Visit (INDEPENDENT_AMBULATORY_CARE_PROVIDER_SITE_OTHER): Payer: Medicare Other | Admitting: Cardiovascular Disease

## 2019-01-12 ENCOUNTER — Other Ambulatory Visit: Payer: Self-pay

## 2019-01-12 VITALS — BP 129/85 | HR 77 | Ht 61.0 in | Wt 196.6 lb

## 2019-01-12 DIAGNOSIS — I10 Essential (primary) hypertension: Secondary | ICD-10-CM | POA: Diagnosis not present

## 2019-01-12 DIAGNOSIS — R0789 Other chest pain: Secondary | ICD-10-CM | POA: Diagnosis not present

## 2019-01-12 DIAGNOSIS — R52 Pain, unspecified: Secondary | ICD-10-CM

## 2019-01-12 DIAGNOSIS — Z01812 Encounter for preprocedural laboratory examination: Secondary | ICD-10-CM | POA: Diagnosis not present

## 2019-01-12 MED ORDER — ASPIRIN EC 81 MG PO TBEC: 81.0000 mg | DELAYED_RELEASE_TABLET | Freq: Once | ORAL | 0 refills | Status: AC

## 2019-01-12 MED ORDER — ASPIRIN EC 81 MG PO TBEC: 81.0000 mg | DELAYED_RELEASE_TABLET | Freq: Once | ORAL | 0 refills | Status: DC

## 2019-01-12 NOTE — Assessment & Plan Note (Signed)
Robin Ruiz was referred to me by Dr. Claudie Leach for diagnostic coronary angiography because of ongoing atypical chest pain with a negative Myoview stress test 12/03/2018.  She does have risk factors including diabetes and essential hypertension.  She has chest pain on a daily basis lasting up to 15 to 20 minutes at a time. The patient understands that risks included but are not limited to stroke (1 in 1000), death (1 in 36), kidney failure [usually temporary] (1 in 500), bleeding (1 in 200), allergic reaction [possibly serious] (1 in 200). The patient understands and agrees to proceed

## 2019-01-12 NOTE — Patient Instructions (Addendum)
    [place] [address] [address] Dept: [phone] Loc: [phone]  LENNIX ROTUNDO  01/12/2019  You are scheduled for a Cardiac Catheterization on Thursday, November 5 with Dr. Quay Burow.  1. Please arrive at the [facility] (Main Entrance A) at [facility]: [address] [place], [place] [address] at 9:30 AM (This time is two hours before your procedure to ensure your preparation). Free valet parking service is available.   Special note: Every effort is made to have your procedure done on time. Please understand that emergencies sometimes delay scheduled procedures.  2. Diet: Do not eat solid foods after midnight.  The patient may have clear liquids until 5am upon the day of the procedure.  3. Labs:  You will need to have blood drawn today: Go to:  [facility] at [address] [address] [facility] [address], [place], [place] [address] FOR YOUR BASIC METABOLIC PANEL, COMPLETE BLOOD COUNT, AND THYROID STIMULATING HORMONE LAB WORK. NO APPOINTMENT IS NEEDED. YOU MUST HAVE THIS LAB WORK DONE BEFORE GOING TO GET YOUR COVID-19 TEST DONE BECAUSE YOU WILL NEED TO SELF-ISOLATE AFTER THE COVID-19 TEST UNTIL THE DAY OF YOUR [place].  You will need a COVID-19 test on 01/18/2019 at 11:00 am Go to: [facility] Entr[facility] [place], [place] [address] FOR YOUR COVID-19 TEST. YOU MUST HAVE YOUR COVID-19 TEST COMPLETED 4 DAYS PRIOR TO YOUR UPCOMING PROCEDURE/TEST. YOU WILL ALSO NEED TO SELF-ISOLATE AFTER THE COVID-19 TEST UNTIL THE DAY OF YOUR PROCEDURE/TEST. PLEASE BRING YOUR I.D. AND YOUR INSURANCE CARD(S) WITH YOU.  4. Medication instructions in preparation for your procedure:  Hold pioglitazone, metformin, and glipizide on the morning of the procedure  HOLD APIXABAN (ELIQUIS) TWO DAYS PRIOR TO YOUR PROCEDURE  On the morning of your  procedure, take your Aspirin 81 mg and any morning medicines NOT listed above.  You may use sips of water.  5. Plan for one night stay--bring personal belongings. 6. Bring a current list of your medications and current insurance cards. 7. You MUST have a responsible person to drive you home. 8. Someone MUST be with you the first 24 hours after you arrive home or your discharge will be delayed. 9. Please wear clothes that are easy to get on and off and wear slip-on shoes.  ________________________________________________________________ Follow-Up: At [facility], you and your health needs are our priority.  As part of our continuing mission to provide you with exceptional heart care, we have created designated Provider Care Teams.  These Care Teams include your primary Cardiologist (physician) and Advanced Practice Providers (APPs -  Physician Assistants and Nurse Practitioners) who all work together to provide you with the care you need, when you need it. You will need a follow up appointment in with Dr. Quay Burow after your procedure.  To be scheduled

## 2019-01-12 NOTE — Assessment & Plan Note (Signed)
History of essential hypertension with blood pressure measured today 129/85.  She is on metoprolol

## 2019-01-12 NOTE — Progress Notes (Signed)
01/12/2019 Robin Ruiz   04-May-1965  [id number]  Primary Physician No primary care provider on file. Primary Cardiologist: Lorretta Harp MD Robin Ruiz, [place], [place]  HPI:  Robin Ruiz is a 53 y.o. severely overweight single Caucasian female mother of 2 children who does not work because she is on disability.  She was referred by Dr. Claudie Leach for diagnostic coronary angiography because of ongoing chest pain despite a negative Myoview stress test 12/03/2018.  Risk factors include treated diabetes and hypertension.  She is never had a heart attack but has had "TIAs.  There is no family history for heart disease.  She is had A. fib ablation in the past.  She had negative Myoview stress test 12/03/2018 and since that time is continued to have daily atypical chest pain lasting 15 to 20 minutes at a time.  The pain is also awakened from sleep.   Current Meds  Medication Sig  . albuterol (PROVENTIL) (2.5 MG/3ML) 0.083% nebulizer solution Take 2.5 mg by nebulization every 6 (six) hours as needed for wheezing.  . Fluticasone-Salmeterol (ADVAIR) 250-50 MCG/DOSE AEPB Inhale 1 puff into the lungs every 12 (twelve) hours.  Marland Kitchen glipiZIDE (GLUCOTROL) 10 MG tablet Take 10 mg by mouth every morning.  . Iron-FA-B Cmp-C-Biot-Probiotic (FUSION PLUS PO) Take by mouth.  . metFORMIN (GLUCOPHAGE) 1000 MG tablet Take 1,000 mg by mouth 2 (two) times daily with a meal.  . metoprolol tartrate (LOPRESSOR) 25 MG tablet Take 25 mg by mouth 2 (two) times daily.  . montelukast (SINGULAIR) 10 MG tablet Take 10 mg by mouth every morning.  . naproxen (NAPROSYN) 500 MG tablet Take 1 tablet (500 mg total) by mouth 2 (two) times daily with a meal.  . pioglitazone (ACTOS) 15 MG tablet Take 15 mg by mouth daily.     Allergies  Allergen Reactions  . Codeine   . Doxycycline     Social History   Socioeconomic History  . Marital status: Unknown    Spouse name: Not on file  . Number of children: Not on file  .  Years of education: Not on file  . Highest education level: Not on file  Occupational History  . Not on file  Social Needs  . Financial resource strain: Not on file  . Food insecurity    Worry: Not on file    Inability: Not on file  . Transportation needs    Medical: Not on file    Non-medical: Not on file  Tobacco Use  . Smoking status: Never Smoker  . Smokeless tobacco: Never Used  Substance and Sexual Activity  . Alcohol use: No  . Drug use: No  . Sexual activity: Not on file  Lifestyle  . Physical activity    Days per week: Not on file    Minutes per session: Not on file  . Stress: Not on file  Relationships  . Social Herbalist on phone: Not on file    Gets together: Not on file    Attends religious service: Not on file    Active member of club or organization: Not on file    Attends meetings of clubs or organizations: Not on file    Relationship status: Not on file  . Intimate partner violence    Fear of current or ex partner: Not on file    Emotionally abused: Not on file    Physically abused: Not on file    Forced sexual activity:  Not on file  Other Topics Concern  . Not on file  Social History Narrative  . Not on file     Review of Systems: General: negative for chills, fever, night sweats or weight changes.  Cardiovascular: negative for chest pain, dyspnea on exertion, edema, orthopnea, palpitations, paroxysmal nocturnal dyspnea or shortness of breath Dermatological: negative for rash Respiratory: negative for cough or wheezing Urologic: negative for hematuria Abdominal: negative for nausea, vomiting, diarrhea, bright red blood per rectum, melena, or hematemesis Neurologic: negative for visual changes, syncope, or dizziness All other systems reviewed and are otherwise negative except as noted above.    Blood pressure 129/85, pulse 77, height 5' 1" (1.549 m), weight 196 lb 9.6 oz (89.2 kg), SpO2 100 %.  General appearance: alert and no  distress Neck: no adenopathy, no carotid bruit, no JVD, supple, symmetrical, trachea midline and thyroid not enlarged, symmetric, no tenderness/mass/nodules Lungs: clear to auscultation bilaterally Heart: regular rate and rhythm, S1, S2 normal, no murmur, click, rub or gallop Extremities: extremities normal, atraumatic, no cyanosis or edema Pulses: 2+ and symmetric Skin: Skin color, texture, turgor normal. No rashes or lesions Neurologic: Alert and oriented X 3, normal strength and tone. Normal symmetric reflexes. Normal coordination and gait  EKG normal sinus rhythm at 77 without ST or T wave changes.  I personally reviewed this EKG  ASSESSMENT AND PLAN:   Atypical chest pain Ms. Betts was referred to me by Dr. Rosario for diagnostic coronary angiography because of ongoing atypical chest pain with a negative Myoview stress test 12/03/2018.  She does have risk factors including diabetes and essential hypertension.  She has chest pain on a daily basis lasting up to 15 to 20 minutes at a time. The patient understands that risks included but are not limited to stroke (1 in 1000), death (1 in 1000), kidney failure [usually temporary] (1 in 500), bleeding (1 in 200), allergic reaction [possibly serious] (1 in 200). The patient understands and agrees to proceed  Essential hypertension History of essential hypertension with blood pressure measured today 129/85.  She is on metoprolol      Ellagrace Yoshida J. Allysa Governale MD FACP,FACC,FAHA, FSCAI 01/12/2019 2:12 PM 

## 2019-01-12 NOTE — Addendum Note (Signed)
Addended by: Annita Brod on: 01/12/2019 03:41 PM   Modules accepted: Orders

## 2019-01-12 NOTE — H&P (View-Only) (Signed)
01/12/2019 Robin Ruiz   04-May-1965  [id number]  Primary Physician No primary care provider on file. Primary Cardiologist: Lorretta Harp MD [name], [place], [place]  HPI:  Robin Ruiz is a 53 y.o. severely overweight single Caucasian female mother of 2 children who does not work because she is on disability.  She was referred by Dr. Claudie Leach for diagnostic coronary angiography because of ongoing chest pain despite a negative Myoview stress test 12/03/2018.  Risk factors include treated diabetes and hypertension.  She is never had a heart attack but has had "TIAs.  There is no family history for heart disease.  She is had A. fib ablation in the past.  She had negative Myoview stress test 12/03/2018 and since that time is continued to have daily atypical chest pain lasting 15 to 20 minutes at a time.  The pain is also awakened from sleep.   Current Meds  Medication Sig  . albuterol (PROVENTIL) (2.5 MG/3ML) 0.083% nebulizer solution Take 2.5 mg by nebulization every 6 (six) hours as needed for wheezing.  . Fluticasone-Salmeterol (ADVAIR) 250-50 MCG/DOSE AEPB Inhale 1 puff into the lungs every 12 (twelve) hours.  Marland Kitchen glipiZIDE (GLUCOTROL) 10 MG tablet Take 10 mg by mouth every morning.  . Iron-FA-B Cmp-C-Biot-Probiotic (FUSION PLUS PO) Take by mouth.  . metFORMIN (GLUCOPHAGE) 1000 MG tablet Take 1,000 mg by mouth 2 (two) times daily with a meal.  . metoprolol tartrate (LOPRESSOR) 25 MG tablet Take 25 mg by mouth 2 (two) times daily.  . montelukast (SINGULAIR) 10 MG tablet Take 10 mg by mouth every morning.  . naproxen (NAPROSYN) 500 MG tablet Take 1 tablet (500 mg total) by mouth 2 (two) times daily with a meal.  . pioglitazone (ACTOS) 15 MG tablet Take 15 mg by mouth daily.     Allergies  Allergen Reactions  . Codeine   . Doxycycline     Social History   Socioeconomic History  . Marital status: Unknown    Spouse name: Not on file  . Number of children: Not on file  .  Years of education: Not on file  . Highest education level: Not on file  Occupational History  . Not on file  Social Needs  . Financial resource strain: Not on file  . Food insecurity    Worry: Not on file    Inability: Not on file  . Transportation needs    Medical: Not on file    Non-medical: Not on file  Tobacco Use  . Smoking status: Never Smoker  . Smokeless tobacco: Never Used  Substance and Sexual Activity  . Alcohol use: No  . Drug use: No  . Sexual activity: Not on file  Lifestyle  . Physical activity    Days per week: Not on file    Minutes per session: Not on file  . Stress: Not on file  Relationships  . Social Herbalist on phone: Not on file    Gets together: Not on file    Attends religious service: Not on file    Active member of club or organization: Not on file    Attends meetings of clubs or organizations: Not on file    Relationship status: Not on file  . Intimate partner violence    Fear of current or ex partner: Not on file    Emotionally abused: Not on file    Physically abused: Not on file    Forced sexual activity:  Not on file  Other Topics Concern  . Not on file  Social History Narrative  . Not on file     Review of Systems: General: negative for chills, fever, night sweats or weight changes.  Cardiovascular: negative for chest pain, dyspnea on exertion, edema, orthopnea, palpitations, paroxysmal nocturnal dyspnea or shortness of breath Dermatological: negative for rash Respiratory: negative for cough or wheezing Urologic: negative for hematuria Abdominal: negative for nausea, vomiting, diarrhea, bright red blood per rectum, melena, or hematemesis Neurologic: negative for visual changes, syncope, or dizziness All other systems reviewed and are otherwise negative except as noted above.    Blood pressure 129/85, pulse 77, height 5\' 1"  (1.549 m), weight 196 lb 9.6 oz (89.2 kg), SpO2 100 %.  General appearance: alert and no  distress Neck: no adenopathy, no carotid bruit, no JVD, supple, symmetrical, trachea midline and thyroid not enlarged, symmetric, no tenderness/mass/nodules Lungs: clear to auscultation bilaterally Heart: regular rate and rhythm, S1, S2 normal, no murmur, click, rub or gallop Extremities: extremities normal, atraumatic, no cyanosis or edema Pulses: 2+ and symmetric Skin: Skin color, texture, turgor normal. No rashes or lesions Neurologic: Alert and oriented X 3, normal strength and tone. Normal symmetric reflexes. Normal coordination and gait  EKG normal sinus rhythm at 77 without ST or T wave changes.  I personally reviewed this EKG  ASSESSMENT AND PLAN:   Atypical chest pain Ms. Landgren was referred to me by Dr. Jimmye Norman for diagnostic coronary angiography because of ongoing atypical chest pain with a negative Myoview stress test 12/03/2018.  She does have risk factors including diabetes and essential hypertension.  She has chest pain on a daily basis lasting up to 15 to 20 minutes at a time. The patient understands that risks included but are not limited to stroke (1 in 1000), death (1 in 1000), kidney failure [usually temporary] (1 in 500), bleeding (1 in 200), allergic reaction [possibly serious] (1 in 200). The patient understands and agrees to proceed  Essential hypertension History of essential hypertension with blood pressure measured today 129/85.  She is on metoprolol      12/05/2018 MD [facility], [facility] 01/12/2019 2:12 PM

## 2019-01-13 ENCOUNTER — Telehealth: Payer: Self-pay | Admitting: Cardiovascular Disease

## 2019-01-13 LAB — BASIC METABOLIC PANEL
BUN/Creatinine Ratio: 5 — ABNORMAL LOW (ref 9–23)
BUN: 4 mg/dL — ABNORMAL LOW (ref 6–24)
CO2: 26 mmol/L (ref 20–29)
Calcium: 8.3 mg/dL — ABNORMAL LOW (ref 8.7–10.2)
Chloride: 102 mmol/L (ref 96–106)
Creatinine, Ser: 0.74 mg/dL (ref 0.57–1.00)
GFR calc Af Amer: 107 mL/min/{1.73_m2} (ref >59)
GFR calc non Af Amer: 93 mL/min/{1.73_m2} (ref >59)
Glucose: 121 mg/dL — ABNORMAL HIGH (ref 65–99)
Potassium: 3.2 mmol/L — ABNORMAL LOW (ref 3.5–5.2)
Sodium: 143 mmol/L (ref 134–144)

## 2019-01-13 LAB — CBC
Hematocrit: 32.4 % — ABNORMAL LOW (ref 34.0–46.6)
Hemoglobin: 10.2 g/dL — ABNORMAL LOW (ref 11.1–15.9)
MCH: 28 pg (ref 26.6–33.0)
MCHC: 31.5 g/dL (ref 31.5–35.7)
MCV: 89 fL (ref 79–97)
Platelets: 178 10*3/uL (ref 150–450)
RBC: 3.64 x10E6/uL — ABNORMAL LOW (ref 3.77–5.28)
RDW: 14.4 % (ref 11.7–15.4)
WBC: 5.5 10*3/uL (ref 3.4–10.8)

## 2019-01-13 LAB — TSH: TSH: 1.14 u[IU]/mL (ref 0.450–4.500)

## 2019-01-13 NOTE — Telephone Encounter (Signed)
New message   Patient wants to know if she can have covid testing done in [place]. Patient states that she does not have transportation. Please call today.

## 2019-01-13 NOTE — Telephone Encounter (Signed)
° ° °  Patient calling to reschedule covid testing

## 2019-01-13 NOTE — Telephone Encounter (Signed)
Contacted pt. Pt states [facility] further away for her than [place] testing site. She questioned if [facility] in partnership with [facility] in [place]. Informed pt that it is not and that the following are the available COVID testing sites:   [facility] Entrance [place], [place] [address]  [facility] Pre Admission Testing [facility]) [address] [place], [place] [address]  [place] (Across from [facility]) [address], [place],  [address]  Pt states she will present to [facility] testing site on 11/2. Reviewed other available appt times with pt. She states she will keep her 11:00am appt. Advised pt to call office if she has any more questions or concerns. Pt verbalized understanding

## 2019-01-13 NOTE — Telephone Encounter (Signed)
Pt unable to find transportation for COVID test and states she is only able to have a covid test done on fri, sat or sun. Informed pt that nurse would call pt back tomorrow with updates on available dates and time for procedure and covid test. Pt agreeable

## 2019-01-14 NOTE — Telephone Encounter (Signed)
Attempted call to pt to discuss rescheduling COVID test and procedure. [email or web address]  Plan was to set pt up for a COVID test for Friday morning (transportation issues) and change procedure date to 11/2.   Pt may need COVID test early morning on 11/6 and have procedure rescheduled for 11/9  Other possibility is COVID test 10/31 and self-isolate until procedure on 11/5 if this length of self-isolation is okay for cath lab

## 2019-01-15 NOTE — Telephone Encounter (Signed)
Returned call to pt. Pt okay with current scheduling of COVID test and procedure.  Confirmed the following with the pt:  COVID test: 01/18/2019 at 11:00am  Procedure: 01/21/2019 arrival time 9:30 am  Pt verbalized understanding

## 2019-01-15 NOTE — Telephone Encounter (Signed)
Follow up ° ° °Patient is returning your call. Please call. ° ° ° °

## 2019-01-18 ENCOUNTER — Other Ambulatory Visit (HOSPITAL_COMMUNITY)
Admission: RE | Admit: 2019-01-18 | Discharge: 2019-01-18 | Disposition: A | Discharge status: Home / Self Care | Payer: Medicare Other | Source: Ambulatory Visit | Attending: Cardiovascular Disease | Admitting: Cardiovascular Disease

## 2019-01-18 ENCOUNTER — Encounter: Payer: Self-pay | Admitting: *Deleted

## 2019-01-18 ENCOUNTER — Telehealth: Payer: Self-pay | Admitting: *Deleted

## 2019-01-18 DIAGNOSIS — Z20828 Contact with and (suspected) exposure to other viral communicable diseases: Secondary | ICD-10-CM | POA: Diagnosis not present

## 2019-01-18 DIAGNOSIS — Z01812 Encounter for preprocedural laboratory examination: Secondary | ICD-10-CM | POA: Diagnosis present

## 2019-01-18 NOTE — Telephone Encounter (Addendum)
Discussed with kristin, pharm md, patient to take 40 meq of potassium x 2 doses. Will also make sure she is taking spironolactone. She will need a bmp Wednesday this week.  ----- Message from Lorretta Harp, MD sent at 01/18/2019 12:41 PM EST ----- Needs potassium repletion for K of 3.2 and recheck prior to cath

## 2019-01-18 NOTE — Telephone Encounter (Signed)
This encounter was created in error - please disregard.

## 2019-01-18 NOTE — Telephone Encounter (Signed)
-----   Message from Lorretta Harp, MD sent at 01/18/2019 12:41 PM EST ----- Needs potassium repletion for K of 3.2 and recheck prior to cath

## 2019-01-19 ENCOUNTER — Other Ambulatory Visit: Payer: Self-pay

## 2019-01-19 ENCOUNTER — Other Ambulatory Visit: Payer: Self-pay | Admitting: *Deleted

## 2019-01-19 DIAGNOSIS — R0789 Other chest pain: Secondary | ICD-10-CM

## 2019-01-19 DIAGNOSIS — E876 Hypokalemia: Secondary | ICD-10-CM

## 2019-01-19 LAB — NOVEL CORONAVIRUS, NAA (HOSP ORDER, SEND-OUT TO REF LAB; TAT 18-24 HRS): SARS-CoV-2, NAA: NOT DETECTED

## 2019-01-19 MED ORDER — POTASSIUM CHLORIDE CRYS ER 20 MEQ PO TBCR: EXTENDED_RELEASE_TABLET | ORAL | 0 refills | Status: DC

## 2019-01-19 NOTE — Addendum Note (Signed)
Addended by: Annita Brod on: 01/19/2019 08:49 AM   Modules accepted: Orders, SmartSet

## 2019-01-19 NOTE — Telephone Encounter (Signed)
Spoke with pt who states she is aware of K+ instructions

## 2019-01-20 ENCOUNTER — Telehealth: Payer: Self-pay | Admitting: *Deleted

## 2019-01-20 ENCOUNTER — Telehealth: Payer: Self-pay | Admitting: Cardiovascular Disease

## 2019-01-20 ENCOUNTER — Other Ambulatory Visit: Payer: Self-pay

## 2019-01-20 NOTE — Telephone Encounter (Signed)
See phone note 01/20/19, I spoke with patient.

## 2019-01-20 NOTE — Telephone Encounter (Signed)
New Message ° ° ° °Pt is returning a call  ° ° °Please call back  °

## 2019-01-20 NOTE — Telephone Encounter (Addendum)
Pt contacted pre-catheterization scheduled at [facility] for: Thursday January 21, 2019 11:30 AM Verified arrival time and place: [place]) at: 9 AM -needs STAT BMP   No solid food after midnight prior to cath, clear liquids until 5 AM day of procedure. Contrast allergy: no  Hold: Eliquis- none 01/19/19 until post procedure. Lasix- AM of procedure. Spironolactone-PM prior to procedure. Metformin-day of procedure and 48 hours post procedure. Actos-AM of procedure. Steglatro-PM prior to  procedure  Except hold medications AM meds can be  taken pre-cath with sip of water including: ASA 81 mg-pt does not have ASA-she is advised to continue to quarantine until post procedure- she knows she will be given ASA at hospital if she is not able to take before going to hospital.   Confirmed patient has responsible adult to drive home post procedure and observe 24 hours after arriving home: yes  Currently, due to Covid-19 pandemic, only one support person will be allowed with patient. Must be the same support person for that patient's entire stay, will be screened and required to wear a mask. They will be asked to wait in the waiting room for the duration of the patient's stay.  Patients are required to wear a mask when they enter the hospital.      COVID-19 Pre-Screening Questions:  . In the past 7 to 10 days have you had a cough,  shortness of breath, headache, congestion, fever (100 or greater) body aches, chills, sore throat, or sudden loss of taste or sense of smell? no . Have you been around anyone with known Covid 19? Marland Kitchen Have you been around anyone who is awaiting Covid 19 test results in the past 7 to 10 days? no . Have you been around anyone who has been exposed to Covid 19, or has mentioned symptoms of Covid 19 within the past 7 to 10 days? no    I reviewed procedure/mask/visitor instructions, Covid-19 screening questions with patient, she  verbalized understanding, thanked me for call.

## 2019-01-20 NOTE — Telephone Encounter (Signed)
Patient is returning [place] call to go over instructions for procedure. Will route to her to make her aware.

## 2019-01-21 ENCOUNTER — Encounter (HOSPITAL_COMMUNITY)
Admission: RE | Disposition: A | Discharge status: Home / Self Care | Payer: Self-pay | Source: Home / Self Care | Attending: Cardiovascular Disease

## 2019-01-21 ENCOUNTER — Ambulatory Visit (HOSPITAL_COMMUNITY)
Admission: RE | Admit: 2019-01-21 | Discharge: 2019-01-21 | Disposition: A | Discharge status: Home / Self Care | Payer: Medicare Other | Attending: Cardiovascular Disease | Admitting: Cardiovascular Disease

## 2019-01-21 ENCOUNTER — Other Ambulatory Visit: Payer: Self-pay

## 2019-01-21 DIAGNOSIS — E119 Type 2 diabetes mellitus without complications: Secondary | ICD-10-CM | POA: Diagnosis not present

## 2019-01-21 DIAGNOSIS — Z6836 Body mass index (BMI) 36.0-36.9, adult: Secondary | ICD-10-CM | POA: Diagnosis not present

## 2019-01-21 DIAGNOSIS — R0789 Other chest pain: Secondary | ICD-10-CM | POA: Diagnosis present

## 2019-01-21 DIAGNOSIS — Z79899 Other long term (current) drug therapy: Secondary | ICD-10-CM | POA: Insufficient documentation

## 2019-01-21 DIAGNOSIS — Z881 Allergy status to other antibiotic agents status: Secondary | ICD-10-CM | POA: Insufficient documentation

## 2019-01-21 DIAGNOSIS — I10 Essential (primary) hypertension: Secondary | ICD-10-CM | POA: Insufficient documentation

## 2019-01-21 DIAGNOSIS — E663 Overweight: Secondary | ICD-10-CM | POA: Diagnosis not present

## 2019-01-21 DIAGNOSIS — Z7984 Long term (current) use of oral hypoglycemic drugs: Secondary | ICD-10-CM | POA: Insufficient documentation

## 2019-01-21 HISTORY — PX: LEFT HEART CATH AND CORONARY ANGIOGRAPHY: CATH118249

## 2019-01-21 LAB — GLUCOSE, CAPILLARY
Glucose-Capillary: 158 mg/dL — ABNORMAL HIGH (ref 70–99)
Glucose-Capillary: 107 mg/dL — ABNORMAL HIGH (ref 70–99)

## 2019-01-21 LAB — BASIC METABOLIC PANEL
Anion gap: 9 (ref 5–15)
BUN: 7 mg/dL (ref 6–20)
CO2: 30 mmol/L (ref 22–32)
Calcium: 8.4 mg/dL — ABNORMAL LOW (ref 8.9–10.3)
Chloride: 103 mmol/L (ref 98–111)
Creatinine, Ser: 0.79 mg/dL (ref 0.44–1.00)
GFR calc Af Amer: >60 mL/min (ref >60)
GFR calc non Af Amer: >60 mL/min (ref >60)
Glucose, Bld: 169 mg/dL — ABNORMAL HIGH (ref 70–99)
Potassium: 4.4 mmol/L (ref 3.5–5.1)
Sodium: 142 mmol/L (ref 135–145)

## 2019-01-21 SURGERY — LEFT HEART CATH AND CORONARY ANGIOGRAPHY
Anesthesia: LOCAL

## 2019-01-21 MED ORDER — FENTANYL CITRATE (PF) 100 MCG/2ML IJ SOLN
INTRAMUSCULAR | Status: DC | PRN
  Administered 2019-01-21: 25 ug via INTRAVENOUS

## 2019-01-21 MED ORDER — LIDOCAINE HCL (PF) 1 % IJ SOLN
INTRAMUSCULAR | Status: DC | PRN
  Administered 2019-01-21: 2 mL via INTRADERMAL

## 2019-01-21 MED ORDER — ASPIRIN 81 MG PO CHEW: 81.0000 mg | CHEWABLE_TABLET | ORAL | Status: DC

## 2019-01-21 MED ORDER — SODIUM CHLORIDE 0.9% FLUSH: 3.0000 mL | Freq: Two times a day (BID) | INTRAVENOUS | Status: DC

## 2019-01-21 MED ORDER — SODIUM CHLORIDE 0.9 % WEIGHT BASED INFUSION
3.0000 mL/kg/h | INTRAVENOUS | Status: AC
  Administered 2019-01-21: 3 mL/kg/h via INTRAVENOUS

## 2019-01-21 MED ORDER — SODIUM CHLORIDE 0.9 % IV SOLN: 250.0000 mL | INTRAVENOUS | Status: DC | PRN

## 2019-01-21 MED ORDER — LABETALOL HCL 5 MG/ML IV SOLN: 10.0000 mg | INTRAVENOUS | Status: DC | PRN

## 2019-01-21 MED ORDER — HEPARIN SODIUM (PORCINE) 1000 UNIT/ML IJ SOLN
INTRAMUSCULAR | Status: DC | PRN
  Administered 2019-01-21: 4500 [IU] via INTRAVENOUS

## 2019-01-21 MED ORDER — SODIUM CHLORIDE 0.9 % IV SOLN: INTRAVENOUS | Status: DC

## 2019-01-21 MED ORDER — HEPARIN (PORCINE) IN NACL 1000-0.9 UT/500ML-% IV SOLN
INTRAVENOUS | Status: AC
  Filled 2019-01-21: qty 1000

## 2019-01-21 MED ORDER — SODIUM CHLORIDE 0.9% FLUSH: 3.0000 mL | INTRAVENOUS | Status: DC | PRN

## 2019-01-21 MED ORDER — VERAPAMIL HCL 2.5 MG/ML IV SOLN
INTRAVENOUS | Status: AC
  Filled 2019-01-21: qty 2

## 2019-01-21 MED ORDER — NITROGLYCERIN 1 MG/10 ML FOR IR/CATH LAB
INTRA_ARTERIAL | Status: AC
  Filled 2019-01-21: qty 10

## 2019-01-21 MED ORDER — HEPARIN SODIUM (PORCINE) 1000 UNIT/ML IJ SOLN
INTRAMUSCULAR | Status: AC
  Filled 2019-01-21: qty 1

## 2019-01-21 MED ORDER — FENTANYL CITRATE (PF) 100 MCG/2ML IJ SOLN
INTRAMUSCULAR | Status: AC
  Filled 2019-01-21: qty 2

## 2019-01-21 MED ORDER — ONDANSETRON HCL 4 MG/2ML IJ SOLN: 4.0000 mg | Freq: Four times a day (QID) | INTRAMUSCULAR | Status: DC | PRN

## 2019-01-21 MED ORDER — MIDAZOLAM HCL 2 MG/2ML IJ SOLN
INTRAMUSCULAR | Status: DC | PRN
  Administered 2019-01-21: 1 mg via INTRAVENOUS

## 2019-01-21 MED ORDER — HEPARIN (PORCINE) IN NACL 1000-0.9 UT/500ML-% IV SOLN
INTRAVENOUS | Status: DC | PRN
  Administered 2019-01-21 (×2): 500 mL

## 2019-01-21 MED ORDER — IOHEXOL 350 MG/ML SOLN
INTRAVENOUS | Status: DC | PRN
  Administered 2019-01-21: 25 mL via INTRA_ARTERIAL

## 2019-01-21 MED ORDER — MIDAZOLAM HCL 2 MG/2ML IJ SOLN
INTRAMUSCULAR | Status: AC
  Filled 2019-01-21: qty 2

## 2019-01-21 MED ORDER — LIDOCAINE HCL (PF) 1 % IJ SOLN
INTRAMUSCULAR | Status: AC
  Filled 2019-01-21: qty 30

## 2019-01-21 MED ORDER — ASPIRIN 81 MG PO CHEW
81.0000 mg | CHEWABLE_TABLET | ORAL | Status: AC
  Administered 2019-01-21: 11:00:00 81 mg via ORAL
  Filled 2019-01-21: qty 1

## 2019-01-21 MED ORDER — ACETAMINOPHEN 325 MG PO TABS: 650.0000 mg | ORAL_TABLET | ORAL | Status: DC | PRN

## 2019-01-21 MED ORDER — VERAPAMIL HCL 2.5 MG/ML IV SOLN
INTRA_ARTERIAL | Status: DC | PRN
  Administered 2019-01-21: 7.5 mL via INTRA_ARTERIAL

## 2019-01-21 MED ORDER — HYDRALAZINE HCL 20 MG/ML IJ SOLN: 10.0000 mg | INTRAMUSCULAR | Status: DC | PRN

## 2019-01-21 MED ORDER — SODIUM CHLORIDE 0.9 % WEIGHT BASED INFUSION: 1.0000 mL/kg/h | INTRAVENOUS | Status: DC

## 2019-01-21 SURGICAL SUPPLY — 10 items
CATH OPTITORQUE TIG 4.0 5F (CATHETERS) ×1 IMPLANT
DEVICE RAD COMP TR BAND LRG (VASCULAR PRODUCTS) ×1 IMPLANT
GLIDESHEATH SLEND A-KIT 6F 22G (SHEATH) ×1 IMPLANT
GUIDEWIRE INQWIRE 1.5J.035X260 (WIRE) IMPLANT
INQWIRE 1.5J .035X260CM (WIRE) ×2
KIT HEART LEFT (KITS) ×2 IMPLANT
PACK CARDIAC CATHETERIZATION (CUSTOM PROCEDURE TRAY) ×2 IMPLANT
TRANSDUCER W/STOPCOCK (MISCELLANEOUS) ×2 IMPLANT
TUBING CIL FLEX 10 FLL-RA (TUBING) ×2 IMPLANT
WIRE HI TORQ VERSACORE-J 145CM (WIRE) ×1 IMPLANT

## 2019-01-21 NOTE — Interval H&P Note (Signed)
History and Physical Interval Note:  01/21/2019 Cath Lab Visit (complete for each Cath Lab visit)  Clinical Evaluation Leading to the Procedure:   ACS: No.  Non-ACS:    Anginal Classification: CCS II  Anti-ischemic medical therapy: No Therapy  Non-Invasive Test Results: No non-invasive testing performed  Prior CABG: No previous CABG       12:21 PM  Robin Ruiz  has presented today for surgery, with the diagnosis of chest pain.  The various methods of treatment have been discussed with the patient and family. After consideration of risks, benefits and other options for treatment, the patient has consented to  Procedure(s): LEFT HEART CATH AND CORONARY ANGIOGRAPHY (N/A) as a surgical intervention.  The patient's history has been reviewed, patient examined, no change in status, stable for surgery.  I have reviewed the patient's chart and labs.  Questions were answered to the patient's satisfaction.     Quay Burow

## 2019-01-21 NOTE — Discharge Instructions (Signed)
Radial Site Care ° °This sheet gives you information about how to care for yourself after your procedure. Your health care provider may also give you more specific instructions. If you have problems or questions, contact your health care provider. °What can I expect after the procedure? °After the procedure, it is common to have: °· Bruising and tenderness at the catheter insertion area. °Follow these instructions at home: °Medicines °· Take over-the-counter and prescription medicines only as told by your health care provider. °Insertion site care °· Follow instructions from your health care provider about how to take care of your insertion site. Make sure you: °? Wash your hands with soap and water before you change your bandage (dressing). If soap and water are not available, use hand sanitizer. °? Change your dressing as told by your health care provider. °? Leave stitches (sutures), skin glue, or adhesive strips in place. These skin closures may need to stay in place for 2 weeks or longer. If adhesive strip edges start to loosen and curl up, you may trim the loose edges. Do not remove adhesive strips completely unless your health care provider tells you to do that. °· Check your insertion site every day for signs of infection. Check for: °? Redness, swelling, or pain. °? Fluid or blood. °? Pus or a bad smell. °? Warmth. °· Do not take baths, swim, or use a hot tub until your health care provider approves. °· You may shower 24-48 hours after the procedure, or as directed by your health care provider. °? Remove the dressing and gently wash the site with plain soap and water. °? Pat the area dry with a clean towel. °? Do not rub the site. That could cause bleeding. °· Do not apply powder or lotion to the site. °Activity ° °· For 24 hours after the procedure, or as directed by your health care provider: °? Do not flex or bend the affected arm. °? Do not push or pull heavy objects with the affected arm. °? Do not  drive yourself home from the hospital or clinic. You may drive 24 hours after the procedure unless your health care provider tells you not to. °? Do not operate machinery or power tools. °· Do not lift anything that is heavier than 10 lb (4.5 kg), or the limit that you are told, until your health care provider says that it is safe. °· Ask your health care provider when it is okay to: °? Return to work or school. °? Resume usual physical activities or sports. °? Resume sexual activity. °General instructions °· If the catheter site starts to bleed, raise your arm and put firm pressure on the site. If the bleeding does not stop, get help right away. This is a medical emergency. °· If you went home on the same day as your procedure, a responsible adult should be with you for the first 24 hours after you arrive home. °· Keep all follow-up visits as told by your health care provider. This is important. °Contact a health care provider if: °· You have a fever. °· You have redness, swelling, or yellow drainage around your insertion site. °Get help right away if: °· You have unusual pain at the radial site. °· The catheter insertion area swells very fast. °· The insertion area is bleeding, and the bleeding does not stop when you hold steady pressure on the area. °· Your arm or hand becomes pale, cool, tingly, or numb. °These symptoms may represent a serious problem   that is an emergency. Do not wait to see if the symptoms will go away. Get medical help right away. Call your local emergency services (911 in the [place] Do not drive yourself to the hospital. °Summary °· After the procedure, it is common to have bruising and tenderness at the site. °· Follow instructions from your health care provider about how to take care of your radial site wound. Check the wound every day for signs of infection. °· Do not lift anything that is heavier than 10 lb (4.5 kg), or the limit that you are told, until your health care provider says  that it is safe. °This information is not intended to replace advice given to you by your health care provider. Make sure you discuss any questions you have with your health care provider. °Document Released: 04/06/2010 Document Revised: 04/09/2017 Document Reviewed: 04/09/2017 °Elsevier Patient Education © 2020 [employer] ° °

## 2019-01-22 ENCOUNTER — Ambulatory Visit: Payer: Medicare Other | Admitting: Cardiovascular Disease

## 2019-01-22 ENCOUNTER — Encounter (HOSPITAL_COMMUNITY): Payer: Self-pay | Admitting: Cardiovascular Disease

## 2019-02-09 ENCOUNTER — Ambulatory Visit: Payer: Medicare Other | Admitting: Cardiovascular Disease

## 2019-02-17 ENCOUNTER — Ambulatory Visit: Payer: Medicare Other | Admitting: Cardiovascular Disease

## 2019-02-24 ENCOUNTER — Ambulatory Visit: Payer: Medicare Other | Admitting: Cardiovascular Disease

## 2019-03-16 ENCOUNTER — Ambulatory Visit (INDEPENDENT_AMBULATORY_CARE_PROVIDER_SITE_OTHER): Payer: Medicare Other | Admitting: Cardiovascular Disease

## 2019-03-16 ENCOUNTER — Other Ambulatory Visit: Payer: Self-pay

## 2019-03-16 ENCOUNTER — Encounter: Payer: Self-pay | Admitting: Cardiovascular Disease

## 2019-03-16 DIAGNOSIS — E782 Mixed hyperlipidemia: Secondary | ICD-10-CM

## 2019-03-16 DIAGNOSIS — I10 Essential (primary) hypertension: Secondary | ICD-10-CM

## 2019-03-16 DIAGNOSIS — R0789 Other chest pain: Secondary | ICD-10-CM | POA: Diagnosis not present

## 2019-03-16 DIAGNOSIS — E785 Hyperlipidemia, unspecified: Secondary | ICD-10-CM | POA: Insufficient documentation

## 2019-03-16 NOTE — Progress Notes (Signed)
03/16/2019 Royetta Crochet   02/07/1966  [id number]  Primary Physician Darryl Lent, PA-C Primary Cardiologist: Runell Gess MD FACP, [place], [place], [place]  HPI:  Robin Ruiz is a 53 y.o.  severely overweight single Caucasian female mother of 2 children who does not work because she is on disability.  She was referred by Dr. Hanley Hays for diagnostic coronary angiography because of ongoing chest pain despite a negative Myoview stress test 12/03/2018.  I last saw her in the office 01/12/2019. Risk factors include treated diabetes and hypertension.  She is never had a heart attack but has had "TIAs.  There is no family history for heart disease.  She is had A. fib ablation in the past.  She had negative Myoview stress test 12/03/2018 and since that time is continued to have daily atypical chest pain lasting 15 to 20 minutes at a time.  The pain is also awakened from sleep  I performed outpatient radial diagnostic cardiac catheterization on her 01/21/2019 that was completely normal.  She had minimal symptoms since.   Current Meds  Medication Sig  . albuterol (PROVENTIL) (2.5 MG/3ML) 0.083% nebulizer solution Take 2.5 mg by nebulization every 6 (six) hours as needed for wheezing.  Marland Kitchen apixaban (ELIQUIS) 5 MG TABS tablet Take 5 mg by mouth 2 (two) times daily.   Marland Kitchen atorvastatin (LIPITOR) 40 MG tablet Take 40 mg by mouth daily.  Marland Kitchen b complex vitamins tablet Take 1 tablet by mouth daily.  Marland Kitchen Ertugliflozin L-PyroglutamicAc (STEGLATRO) 5 MG TABS Take 5 mg by mouth daily.  Marland Kitchen esomeprazole (NEXIUM) 40 MG capsule Take 40 mg by mouth 2 (two) times daily before a meal.  . famotidine (PEPCID) 40 MG tablet Take 40 mg by mouth at bedtime.  . ferrous sulfate 325 (65 FE) MG tablet Take 325 mg by mouth daily with breakfast.  . flecainide (TAMBOCOR) 50 MG tablet Take 50 mg by mouth 2 (two) times daily.  . Fluticasone-Salmeterol (ADVAIR) 250-50 MCG/DOSE AEPB Inhale 1 puff into the lungs 2 (two) times daily as  needed (asthma).   . furosemide (LASIX) 40 MG tablet Take 40 mg by mouth.  . metFORMIN (GLUCOPHAGE) 1000 MG tablet Take 1,000 mg by mouth 2 (two) times daily with a meal.  . metoprolol succinate (TOPROL-XL) 50 MG 24 hr tablet Take 75 mg by mouth daily.  . metoprolol tartrate (LOPRESSOR) 50 MG tablet Take 50 mg by mouth 2 (two) times daily.   . montelukast (SINGULAIR) 10 MG tablet Take 10 mg by mouth every morning.  . pioglitazone (ACTOS) 30 MG tablet Take 30 mg by mouth daily.   Marland Kitchen spironolactone (ALDACTONE) 50 MG tablet Take 50 mg by mouth daily.  . [DISCONTINUED] ondansetron (ZOFRAN-ODT) 8 MG disintegrating tablet Take 8 mg by mouth every 4 (four) hours as needed.  . [DISCONTINUED] potassium chloride SA (KLOR-CON) 20 MEQ tablet Take 2 tablets this morning and 2 tablets this evening     Allergies  Allergen Reactions  . Codeine Nausea And Vomiting  . Doxycycline Nausea And Vomiting    Social History   Socioeconomic History  . Marital status: Single    Spouse name: Not on file  . Number of children: Not on file  . Years of education: Not on file  . Highest education level: Not on file  Occupational History  . Not on file  Tobacco Use  . Smoking status: Never Smoker  . Smokeless tobacco: Never Used  Substance and Sexual Activity  .  Alcohol use: No  . Drug use: No  . Sexual activity: Not on file  Other Topics Concern  . Not on file  Social History Narrative  . Not on file   Social Determinants of Health   Financial Resource Strain:   . Difficulty of Paying Living Expenses: Not on file  Food Insecurity:   . Worried About Charity fundraiser in the Last Year: Not on file  . Ran Out of Food in the Last Year: Not on file  Transportation Needs:   . Lack of Transportation (Medical): Not on file  . Lack of Transportation (Non-Medical): Not on file  Physical Activity:   . Days of Exercise per Week: Not on file  . Minutes of Exercise per Session: Not on file  Stress:   .  Feeling of Stress : Not on file  Social Connections:   . Frequency of Communication with Friends and Family: Not on file  . Frequency of Social Gatherings with Friends and Family: Not on file  . Attends Religious Services: Not on file  . Active Member of Clubs or Organizations: Not on file  . Attends Archivist Meetings: Not on file  . Marital Status: Not on file  Intimate Partner Violence:   . Fear of Current or Ex-Partner: Not on file  . Emotionally Abused: Not on file  . Physically Abused: Not on file  . Sexually Abused: Not on file     Review of Systems: General: negative for chills, fever, night sweats or weight changes.  Cardiovascular: negative for chest pain, dyspnea on exertion, edema, orthopnea, palpitations, paroxysmal nocturnal dyspnea or shortness of breath Dermatological: negative for rash Respiratory: negative for cough or wheezing Urologic: negative for hematuria Abdominal: negative for nausea, vomiting, diarrhea, bright red blood per rectum, melena, or hematemesis Neurologic: negative for visual changes, syncope, or dizziness All other systems reviewed and are otherwise negative except as noted above.    Blood pressure 114/72, pulse 62, temperature (!) 96.6 F (35.9 C), height 5\' 1"  (1.549 m), weight 185 lb 9.6 oz (84.2 kg), SpO2 99 %.  General appearance: alert and no distress Neck: no adenopathy, no carotid bruit, no JVD, supple, symmetrical, trachea midline and thyroid not enlarged, symmetric, no tenderness/mass/nodules Lungs: clear to auscultation bilaterally Heart: regular rate and rhythm, S1, S2 normal, no murmur, click, rub or gallop Extremities: extremities normal, atraumatic, no cyanosis or edema Pulses: 2+ and symmetric Skin: Skin color, texture, turgor normal. No rashes or lesions Neurologic: Alert and oriented X 3, normal strength and tone. Normal symmetric reflexes. Normal coordination and gait  EKG not performed today  ASSESSMENT AND  PLAN:   Essential hypertension History of essential hypertension blood pressure measured today 114/72.  She is on metoprolol.  Hyperlipidemia History of hyperlipidemia on statin therapy followed by her PCP  Atypical chest pain History of atypical chest pain with a negative Myoview stress test 12/03/2018 with ongoing symptoms.  I performed outpatient radial diagnostic cath on her 01/21/2019 that was essentially normal with no evidence of CAD.  She has had minimal symptoms since that time.      Lorretta Harp MD FACP,FACC,FAHA, [facility] 03/16/2019 11:26 AM

## 2019-03-16 NOTE — Patient Instructions (Signed)

## 2019-03-16 NOTE — Assessment & Plan Note (Signed)
History of atypical chest pain with a negative Myoview stress test 12/03/2018 with ongoing symptoms.  I performed outpatient radial diagnostic cath on her 01/21/2019 that was essentially normal with no evidence of CAD.  She has had minimal symptoms since that time.

## 2019-03-16 NOTE — Assessment & Plan Note (Signed)
History of hyperlipidemia on statin therapy followed by her PCP. 

## 2019-03-16 NOTE — Assessment & Plan Note (Signed)
History of essential hypertension blood pressure measured today 114/72.  She is on metoprolol. 

## 2021-10-09 ENCOUNTER — Other Ambulatory Visit (HOSPITAL_COMMUNITY): Payer: Self-pay | Admitting: Emergency Medicine

## 2021-10-09 ENCOUNTER — Other Ambulatory Visit (HOSPITAL_COMMUNITY): Payer: Self-pay | Admitting: Cardiovascular Disease

## 2021-10-09 ENCOUNTER — Encounter (HOSPITAL_COMMUNITY): Payer: Self-pay | Admitting: Emergency Medicine

## 2021-10-09 DIAGNOSIS — I25119 Atherosclerotic heart disease of native coronary artery with unspecified angina pectoris: Secondary | ICD-10-CM

## 2021-10-09 DIAGNOSIS — R0789 Other chest pain: Secondary | ICD-10-CM

## 2021-10-09 MED ORDER — METOPROLOL TARTRATE 100 MG PO TABS: 100.0000 mg | ORAL_TABLET | Freq: Once | ORAL | 0 refills | Status: DC

## 2021-10-22 ENCOUNTER — Telehealth (HOSPITAL_COMMUNITY): Payer: Self-pay | Admitting: Emergency Medicine

## 2021-10-22 DIAGNOSIS — R0789 Other chest pain: Secondary | ICD-10-CM

## 2021-10-22 MED ORDER — METOPROLOL TARTRATE 100 MG PO TABS: 100.0000 mg | ORAL_TABLET | Freq: Once | ORAL | 0 refills | Status: DC

## 2021-10-22 MED ORDER — IVABRADINE HCL 5 MG PO TABS: 15.0000 mg | ORAL_TABLET | Freq: Once | ORAL | 0 refills | Status: AC

## 2021-10-22 NOTE — Telephone Encounter (Signed)
15mg  ivabradine sent to her pharm since her last two BPs from [facility] were on the low end of normal. I informed the patient that this could be an out of pocket expense for her and she told me she lived on a fixed income.  Based on charge by her pharmacy she will decide if shes taking metop or ivabradine RN Navigator Cardiac Imaging [facility] V[facility] [phone] Office  [phone] Cell

## 2021-10-25 ENCOUNTER — Ambulatory Visit (HOSPITAL_COMMUNITY)
Admission: RE | Admit: 2021-10-25 | Discharge: 2021-10-25 | Disposition: A | Discharge status: Home / Self Care | Payer: Medicare HMO | Source: Ambulatory Visit | Attending: Cardiovascular Disease | Admitting: Cardiovascular Disease

## 2021-10-25 DIAGNOSIS — I25119 Atherosclerotic heart disease of native coronary artery with unspecified angina pectoris: Secondary | ICD-10-CM

## 2021-10-25 NOTE — Progress Notes (Signed)
Patient came in today for CT heart scan. Upon review with patient about medications and allergies patient stated that she is allergic to contrast. Patient stated with a previous CT scan she had the reaction of shortness of breath and increased heart rate.  Patient did not receive any pre medications for allergy. Notified Dr. Anne Fu (reader for today) and was told she could only be done with a full 13 hour prep. Notified Huntley Dec, CT heart navigator, who stated she would reach out to the ordering provider. Nothing further needed at this time.

## 2021-11-06 ENCOUNTER — Other Ambulatory Visit (HOSPITAL_COMMUNITY): Payer: Self-pay | Admitting: *Deleted

## 2021-11-06 DIAGNOSIS — R0789 Other chest pain: Secondary | ICD-10-CM

## 2021-11-06 MED ORDER — METOPROLOL TARTRATE 100 MG PO TABS: 100.0000 mg | ORAL_TABLET | Freq: Once | ORAL | 0 refills | Status: AC

## 2021-11-06 MED ORDER — PREDNISONE 50 MG PO TABS: ORAL_TABLET | ORAL | 0 refills | Status: AC

## 2021-11-14 ENCOUNTER — Telehealth (HOSPITAL_COMMUNITY): Payer: Self-pay | Admitting: Emergency Medicine

## 2021-11-14 NOTE — Telephone Encounter (Signed)
Attempted to call patient regarding upcoming cardiac CT appointment. Left message on voicemail with name and callback number Rockwell Alexandria RN Navigator Cardiac Imaging [facility] Heart and Vascular Services [phone] Office [phone] Cell   Explained that patient will need 15mg  ivabradine 2 hr prior to scan and 13 hr prep (prednisone and benadryl) from pharmacy ([facility])

## 2021-11-16 ENCOUNTER — Ambulatory Visit (HOSPITAL_COMMUNITY)
Admission: RE | Admit: 2021-11-16 | Discharge: 2021-11-16 | Disposition: A | Discharge status: Home / Self Care | Payer: Medicare HMO | Source: Ambulatory Visit | Attending: Cardiovascular Disease | Admitting: Cardiovascular Disease

## 2021-11-16 DIAGNOSIS — I700 Atherosclerosis of aorta: Secondary | ICD-10-CM | POA: Diagnosis not present

## 2021-11-16 DIAGNOSIS — I25119 Atherosclerotic heart disease of native coronary artery with unspecified angina pectoris: Secondary | ICD-10-CM | POA: Diagnosis not present

## 2021-11-16 MED ORDER — IOHEXOL 350 MG/ML SOLN
90.0000 mL | Freq: Once | INTRAVENOUS | Status: AC | PRN
  Administered 2021-11-16: 90 mL via INTRAVENOUS

## 2021-11-16 MED ORDER — METOPROLOL TARTRATE 5 MG/5ML IV SOLN
INTRAVENOUS | Status: AC
  Administered 2021-11-16: 5 mg via INTRAVENOUS
  Filled 2021-11-16: qty 5

## 2021-11-16 MED ORDER — SODIUM CHLORIDE 0.9 % IV BOLUS
500.0000 mL | Freq: Once | INTRAVENOUS | Status: AC
  Administered 2021-11-16: 500 mL via INTRAVENOUS

## 2021-11-16 MED ORDER — METOPROLOL TARTRATE 5 MG/5ML IV SOLN
INTRAVENOUS | Status: AC
  Filled 2021-11-16: qty 10

## 2021-11-16 MED ORDER — DILTIAZEM HCL 25 MG/5ML IV SOLN
INTRAVENOUS | Status: AC
  Administered 2021-11-16: 7.5 mg via INTRAVENOUS
  Filled 2021-11-16: qty 5

## 2021-11-16 MED ORDER — NITROGLYCERIN 0.4 MG SL SUBL
SUBLINGUAL_TABLET | SUBLINGUAL | Status: AC
  Filled 2021-11-16: qty 2

## 2021-11-16 MED ORDER — DILTIAZEM HCL 25 MG/5ML IV SOLN
15.0000 mg | Freq: Once | INTRAVENOUS | Status: AC
Start: 2021-11-16 — End: 2021-11-16

## 2021-11-16 MED ORDER — NITROGLYCERIN 0.4 MG SL SUBL
0.8000 mg | SUBLINGUAL_TABLET | Freq: Once | SUBLINGUAL | Status: AC
  Administered 2021-11-16: 0.8 mg via SUBLINGUAL

## 2021-11-16 MED ORDER — METOPROLOL TARTRATE 5 MG/5ML IV SOLN
5.0000 mg | INTRAVENOUS | Status: DC | PRN
  Administered 2021-11-16 (×2): 5 mg via INTRAVENOUS

## 2022-03-29 ENCOUNTER — Ambulatory Visit: Payer: Medicare HMO | Admitting: Nurse Practitioner

## 2022-04-03 ENCOUNTER — Ambulatory Visit: Payer: Medicare Other | Attending: Cardiovascular Disease | Admitting: Cardiovascular Disease

## 2022-04-03 ENCOUNTER — Encounter: Payer: Self-pay | Admitting: Cardiovascular Disease

## 2022-04-03 VITALS — BP 118/68 | HR 78 | Ht 65.0 in | Wt 166.8 lb

## 2022-04-03 DIAGNOSIS — I10 Essential (primary) hypertension: Secondary | ICD-10-CM | POA: Insufficient documentation

## 2022-04-03 DIAGNOSIS — R0789 Other chest pain: Secondary | ICD-10-CM | POA: Insufficient documentation

## 2022-04-03 DIAGNOSIS — E782 Mixed hyperlipidemia: Secondary | ICD-10-CM

## 2022-04-03 DIAGNOSIS — I480 Paroxysmal atrial fibrillation: Secondary | ICD-10-CM | POA: Diagnosis present

## 2022-04-03 DIAGNOSIS — I4891 Unspecified atrial fibrillation: Secondary | ICD-10-CM | POA: Insufficient documentation

## 2022-04-03 NOTE — Assessment & Plan Note (Signed)
History of hyperlipidemia on statin therapy followed by her PCP 

## 2022-04-03 NOTE — Assessment & Plan Note (Signed)
History of PAF status post remote ablation currently on flecainide and Eliquis maintaining sinus rhythm.

## 2022-04-03 NOTE — Patient Instructions (Signed)
Medication Instructions:  Your physician recommends that you continue on your current medications as directed. Please refer to the Current Medication list given to you today.  *If you need a refill on your cardiac medications before your next appointment, please call your pharmacy*   Follow-Up: At [facility], you and your health needs are our priority.  As part of our continuing mission to provide you with exceptional heart care, we have created designated Provider Care Teams.  These Care Teams include your primary Cardiologist (physician) and Advanced Practice Providers (APPs -  Physician Assistants and Nurse Practitioners) who all work together to provide you with the care you need, when you need it.  We recommend signing up for the patient portal called "MyChart".  Sign up information is provided on this After Visit Summary.  MyChart is used to connect with patients for Virtual Visits (Telemedicine).  Patients are able to view lab/test results, encounter notes, upcoming appointments, etc.  Non-urgent messages can be sent to your provider as well.   To learn more about what you can do with MyChart, go to [email or web address].    Your next appointment:   We will see you on an as needed basis.  Provider:   Jonathan Berry, MD  

## 2022-04-03 NOTE — Assessment & Plan Note (Signed)
History of atypical chest pain status post outpatient radial diagnostic cath by myself 01/21/2019 which was entirely normal.  She did have a abnormal Myoview stress test performed by her referring cardiologist 08/13/2021 that showed abnormalities in multiple vascular territories.  A coronary CTA performed 11/16/2021 revealed a coronary calcium score of 13 with minimal nonobstructive plaque.  I suspect her chest pain is noncardiac.

## 2022-04-03 NOTE — Progress Notes (Signed)
04/03/2022 Robin Ruiz   [birth year]  [id number]  Primary Physician Windell Hummingbird, PA-C Primary Cardiologist: Lorretta Harp MD FACP, [place], [place], [place]  HPI:  Robin Ruiz is a 57 y.o.   severely overweight single Caucasian female mother of 2 children who does not work because she is on disability.  She was referred by Dr. Claudie Leach for diagnostic coronary angiography because of ongoing chest pain despite a negative Myoview stress test 12/03/2018.  I last saw her in the office 01/12/2019.  Risk factors include treated diabetes and hypertension.  She is never had a heart attack but has had "TIAs.  There is no family history for heart disease.  She is had A. fib ablation in the past.  She had negative Myoview stress test 12/03/2018 and since that time is continued to have daily atypical chest pain lasting 15 to 20 minutes at a time.  The pain is also awakened from sleep.  I performed outpatient radial diagnostic cath on her 01/21/2019 which was entirely normal.  Since that time she still has occasional atypical chest pain.  She did have a Myoview stress test performed by her referring cardiologist 08/13/2021 that showed perfusion abnormalities in multiple vascular territories.  A 2D echo performed 12/14/2021 was essentially normal with grade 1 diastolic dysfunction and mild pulm hypertension.  She did have a coronary CTA performed 11/16/2021 that showed a coronary calcium score 13 with minimal nonobstructive CAD.   Current Meds  Medication Sig   albuterol (PROVENTIL) (2.5 MG/3ML) 0.083% nebulizer solution Take 2.5 mg by nebulization every 6 (six) hours as needed for wheezing.   atorvastatin (LIPITOR) 40 MG tablet Take 40 mg by mouth daily.   b complex vitamins tablet Take 1 tablet by mouth daily.   dapagliflozin propanediol (FARXIGA) 10 MG TABS tablet Take 10 mg by mouth daily.   Ertugliflozin L-PyroglutamicAc (STEGLATRO) 5 MG TABS Take 5 mg by mouth daily.   esomeprazole (NEXIUM) 40 MG  capsule Take 40 mg by mouth 2 (two) times daily before a meal.   famotidine (PEPCID) 40 MG tablet Take 40 mg by mouth at bedtime.   ferrous sulfate 325 (65 FE) MG tablet Take 325 mg by mouth daily with breakfast.   Fluticasone-Umeclidin-Vilant (TRELEGY ELLIPTA) 100-62.5-25 MCG/ACT AEPB Inhale 1 puff into the lungs daily.   furosemide (LASIX) 40 MG tablet Take 40 mg by mouth.   metFORMIN (GLUCOPHAGE) 1000 MG tablet Take 1,000 mg by mouth 2 (two) times daily with a meal.   montelukast (SINGULAIR) 10 MG tablet Take 10 mg by mouth every morning.   pioglitazone (ACTOS) 30 MG tablet Take 30 mg by mouth daily.    spironolactone (ALDACTONE) 50 MG tablet Take 50 mg by mouth daily.     Allergies  Allergen Reactions   Iodinated Contrast Media Shortness Of Breath and Palpitations    Patient reports shortness of breath and increased heart rate with CT contrast   Clindamycin Nausea And Vomiting   Codeine Nausea And Vomiting and Nausea Only   Doxycycline Nausea And Vomiting and Nausea Only    Social History   Socioeconomic History   Marital status: Single    Spouse name: Not on file   Number of children: Not on file   Years of education: Not on file   Highest education level: Not on file  Occupational History   Not on file  Tobacco Use   Smoking status: Never   Smokeless tobacco: Never  Substance and Sexual Activity  Alcohol use: No   Drug use: No   Sexual activity: Not on file  Other Topics Concern   Not on file  Social History Narrative   Not on file   Social Determinants of Health   Financial Resource Strain: Not on file  Food Insecurity: Not on file  Transportation Needs: Not on file  Physical Activity: Not on file  Stress: Not on file  Social Connections: Not on file  Intimate Partner Violence: Not on file     Review of Systems: General: negative for chills, fever, night sweats or weight changes.  Cardiovascular: negative for chest pain, dyspnea on exertion, edema,  orthopnea, palpitations, paroxysmal nocturnal dyspnea or shortness of breath Dermatological: negative for rash Respiratory: negative for cough or wheezing Urologic: negative for hematuria Abdominal: negative for nausea, vomiting, diarrhea, bright red blood per rectum, melena, or hematemesis Neurologic: negative for visual changes, syncope, or dizziness All other systems reviewed and are otherwise negative except as noted above.    Blood pressure 118/68, pulse 78, height 5\' 5"  (1.651 m), weight 166 lb 12.8 oz (75.7 kg), SpO2 99 %.  General appearance: alert and no distress Neck: no adenopathy, no carotid bruit, no JVD, supple, symmetrical, trachea midline, and thyroid not enlarged, symmetric, no tenderness/mass/nodules Lungs: clear to auscultation bilaterally Heart: regular rate and rhythm, S1, S2 normal, no murmur, click, rub or gallop Extremities: extremities normal, atraumatic, no cyanosis or edema Pulses: 2+ and symmetric Skin: Skin color, texture, turgor normal. No rashes or lesions Neurologic: Grossly normal  EKG sinus rhythm at 78 with occasional PVCs.  I personally reviewed this EKG.  ASSESSMENT AND PLAN:   Essential hypertension History of essential hypertension a blood pressure measured today of 118/68.  She is on metoprolol and Aldactone for  Atypical chest pain History of atypical chest pain status post outpatient radial diagnostic cath by myself 01/21/2019 which was entirely normal.  She did have a abnormal Myoview stress test performed by her referring cardiologist 08/13/2021 that showed abnormalities in multiple vascular territories.  A coronary CTA performed 11/16/2021 revealed a coronary calcium score of 13 with minimal nonobstructive plaque.  I suspect her chest pain is noncardiac.  Atrial fibrillation ([place]) History of PAF status post remote ablation currently on flecainide and Eliquis maintaining sinus rhythm.  Hyperlipidemia History of hyperlipidemia on statin therapy  followed by her PCP.     Lorretta Harp MD FACP,FACC,FAHA, [facility] 04/03/2022 11:49 AM

## 2022-04-03 NOTE — Assessment & Plan Note (Signed)
History of essential hypertension a blood pressure measured today of 118/68.  She is on metoprolol and Aldactone for

## 2022-04-10 ENCOUNTER — Ambulatory Visit: Payer: Medicare HMO | Admitting: Cardiovascular Disease
